# Patient Record
Sex: Female | Born: 1965
Health system: Southern US, Community
[De-identification: ages and names within clinical notes are randomized; demographics above are authoritative.]

## PROBLEM LIST (undated history)

## (undated) DIAGNOSIS — G43109 Migraine with aura, not intractable, without status migrainosus: Secondary | ICD-10-CM

## (undated) DIAGNOSIS — Z87440 Personal history of urinary (tract) infections: Secondary | ICD-10-CM

## (undated) DIAGNOSIS — E079 Disorder of thyroid, unspecified: Secondary | ICD-10-CM

## (undated) DIAGNOSIS — M5126 Other intervertebral disc displacement, lumbar region: Secondary | ICD-10-CM

## (undated) DIAGNOSIS — Z8742 Personal history of other diseases of the female genital tract: Secondary | ICD-10-CM

## (undated) DIAGNOSIS — R112 Nausea with vomiting, unspecified: Secondary | ICD-10-CM

## (undated) DIAGNOSIS — M792 Neuralgia and neuritis, unspecified: Secondary | ICD-10-CM

## (undated) DIAGNOSIS — M5430 Sciatica, unspecified side: Secondary | ICD-10-CM

## (undated) DIAGNOSIS — T4145XA Adverse effect of unspecified anesthetic, initial encounter: Secondary | ICD-10-CM

## (undated) DIAGNOSIS — Z872 Personal history of diseases of the skin and subcutaneous tissue: Secondary | ICD-10-CM

## (undated) DIAGNOSIS — Z9889 Other specified postprocedural states: Secondary | ICD-10-CM

## (undated) DIAGNOSIS — G8929 Other chronic pain: Secondary | ICD-10-CM

## (undated) DIAGNOSIS — I7300 Raynaud's syndrome without gangrene: Secondary | ICD-10-CM

## (undated) HISTORY — DX: Other chronic pain: G89.29

## (undated) HISTORY — DX: Other intervertebral disc displacement, lumbar region: M51.26

## (undated) HISTORY — DX: Neuralgia and neuritis, unspecified: M79.2

## (undated) HISTORY — DX: Personal history of other diseases of the female genital tract: Z87.42

## (undated) HISTORY — DX: Personal history of diseases of the skin and subcutaneous tissue: Z87.2

## (undated) HISTORY — PX: COLONOSCOPY: SHX174

## (undated) HISTORY — PX: CHOLECYSTECTOMY: SHX55

## (undated) HISTORY — DX: Personal history of urinary (tract) infections: Z87.440

## (undated) HISTORY — DX: Disorder of thyroid, unspecified: E07.9

## (undated) HISTORY — DX: Migraine with aura, not intractable, without status migrainosus: G43.109

## (undated) HISTORY — PX: TONSILLECTOMY: SUR1361

## (undated) HISTORY — DX: Sciatica, unspecified side: M54.30

## (undated) HISTORY — PX: KNEE SURGERY: SHX244

## (undated) HISTORY — DX: Raynaud's syndrome without gangrene: I73.00

---
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
## 1998-12-03 DIAGNOSIS — G43909 Migraine, unspecified, not intractable, without status migrainosus: Secondary | ICD-10-CM | POA: Insufficient documentation

## 2001-12-03 DIAGNOSIS — F419 Anxiety disorder, unspecified: Secondary | ICD-10-CM | POA: Insufficient documentation

## 2013-08-12 DIAGNOSIS — G43109 Migraine with aura, not intractable, without status migrainosus: Secondary | ICD-10-CM | POA: Insufficient documentation

## 2015-11-24 DIAGNOSIS — D649 Anemia, unspecified: Secondary | ICD-10-CM | POA: Insufficient documentation

## 2016-04-03 DIAGNOSIS — R590 Localized enlarged lymph nodes: Secondary | ICD-10-CM | POA: Insufficient documentation

## 2017-12-26 DIAGNOSIS — M9905 Segmental and somatic dysfunction of pelvic region: Secondary | ICD-10-CM | POA: Diagnosis not present

## 2017-12-26 DIAGNOSIS — M5417 Radiculopathy, lumbosacral region: Secondary | ICD-10-CM | POA: Diagnosis not present

## 2017-12-26 DIAGNOSIS — M9903 Segmental and somatic dysfunction of lumbar region: Secondary | ICD-10-CM | POA: Diagnosis not present

## 2017-12-27 DIAGNOSIS — M9905 Segmental and somatic dysfunction of pelvic region: Secondary | ICD-10-CM | POA: Diagnosis not present

## 2017-12-27 DIAGNOSIS — M5417 Radiculopathy, lumbosacral region: Secondary | ICD-10-CM | POA: Diagnosis not present

## 2017-12-27 DIAGNOSIS — M9903 Segmental and somatic dysfunction of lumbar region: Secondary | ICD-10-CM | POA: Diagnosis not present

## 2017-12-30 DIAGNOSIS — M9905 Segmental and somatic dysfunction of pelvic region: Secondary | ICD-10-CM | POA: Diagnosis not present

## 2017-12-30 DIAGNOSIS — M5417 Radiculopathy, lumbosacral region: Secondary | ICD-10-CM | POA: Diagnosis not present

## 2017-12-30 DIAGNOSIS — M9903 Segmental and somatic dysfunction of lumbar region: Secondary | ICD-10-CM | POA: Diagnosis not present

## 2018-01-01 DIAGNOSIS — M9905 Segmental and somatic dysfunction of pelvic region: Secondary | ICD-10-CM | POA: Diagnosis not present

## 2018-01-01 DIAGNOSIS — M5417 Radiculopathy, lumbosacral region: Secondary | ICD-10-CM | POA: Diagnosis not present

## 2018-01-01 DIAGNOSIS — M9903 Segmental and somatic dysfunction of lumbar region: Secondary | ICD-10-CM | POA: Diagnosis not present

## 2018-01-03 DIAGNOSIS — M9903 Segmental and somatic dysfunction of lumbar region: Secondary | ICD-10-CM | POA: Diagnosis not present

## 2018-01-03 DIAGNOSIS — M9905 Segmental and somatic dysfunction of pelvic region: Secondary | ICD-10-CM | POA: Diagnosis not present

## 2018-01-03 DIAGNOSIS — M5417 Radiculopathy, lumbosacral region: Secondary | ICD-10-CM | POA: Diagnosis not present

## 2018-01-06 DIAGNOSIS — M9903 Segmental and somatic dysfunction of lumbar region: Secondary | ICD-10-CM | POA: Diagnosis not present

## 2018-01-06 DIAGNOSIS — M9905 Segmental and somatic dysfunction of pelvic region: Secondary | ICD-10-CM | POA: Diagnosis not present

## 2018-01-06 DIAGNOSIS — M5417 Radiculopathy, lumbosacral region: Secondary | ICD-10-CM | POA: Diagnosis not present

## 2018-01-07 DIAGNOSIS — M9905 Segmental and somatic dysfunction of pelvic region: Secondary | ICD-10-CM | POA: Diagnosis not present

## 2018-01-07 DIAGNOSIS — M5417 Radiculopathy, lumbosacral region: Secondary | ICD-10-CM | POA: Diagnosis not present

## 2018-01-07 DIAGNOSIS — M9903 Segmental and somatic dysfunction of lumbar region: Secondary | ICD-10-CM | POA: Diagnosis not present

## 2018-01-20 DIAGNOSIS — M9905 Segmental and somatic dysfunction of pelvic region: Secondary | ICD-10-CM | POA: Diagnosis not present

## 2018-01-20 DIAGNOSIS — M5417 Radiculopathy, lumbosacral region: Secondary | ICD-10-CM | POA: Diagnosis not present

## 2018-01-20 DIAGNOSIS — M9903 Segmental and somatic dysfunction of lumbar region: Secondary | ICD-10-CM | POA: Diagnosis not present

## 2018-01-22 DIAGNOSIS — M9905 Segmental and somatic dysfunction of pelvic region: Secondary | ICD-10-CM | POA: Diagnosis not present

## 2018-01-22 DIAGNOSIS — M5417 Radiculopathy, lumbosacral region: Secondary | ICD-10-CM | POA: Diagnosis not present

## 2018-01-22 DIAGNOSIS — M9903 Segmental and somatic dysfunction of lumbar region: Secondary | ICD-10-CM | POA: Diagnosis not present

## 2018-01-24 DIAGNOSIS — M9903 Segmental and somatic dysfunction of lumbar region: Secondary | ICD-10-CM | POA: Diagnosis not present

## 2018-01-24 DIAGNOSIS — M9905 Segmental and somatic dysfunction of pelvic region: Secondary | ICD-10-CM | POA: Diagnosis not present

## 2018-01-24 DIAGNOSIS — M5417 Radiculopathy, lumbosacral region: Secondary | ICD-10-CM | POA: Diagnosis not present

## 2018-01-27 DIAGNOSIS — M5417 Radiculopathy, lumbosacral region: Secondary | ICD-10-CM | POA: Diagnosis not present

## 2018-01-27 DIAGNOSIS — M9903 Segmental and somatic dysfunction of lumbar region: Secondary | ICD-10-CM | POA: Diagnosis not present

## 2018-01-27 DIAGNOSIS — M9905 Segmental and somatic dysfunction of pelvic region: Secondary | ICD-10-CM | POA: Diagnosis not present

## 2018-01-31 DIAGNOSIS — M9903 Segmental and somatic dysfunction of lumbar region: Secondary | ICD-10-CM | POA: Diagnosis not present

## 2018-01-31 DIAGNOSIS — M9905 Segmental and somatic dysfunction of pelvic region: Secondary | ICD-10-CM | POA: Diagnosis not present

## 2018-01-31 DIAGNOSIS — M5417 Radiculopathy, lumbosacral region: Secondary | ICD-10-CM | POA: Diagnosis not present

## 2018-02-04 DIAGNOSIS — M5417 Radiculopathy, lumbosacral region: Secondary | ICD-10-CM | POA: Diagnosis not present

## 2018-02-04 DIAGNOSIS — M9905 Segmental and somatic dysfunction of pelvic region: Secondary | ICD-10-CM | POA: Diagnosis not present

## 2018-02-04 DIAGNOSIS — M9903 Segmental and somatic dysfunction of lumbar region: Secondary | ICD-10-CM | POA: Diagnosis not present

## 2018-02-06 DIAGNOSIS — M9905 Segmental and somatic dysfunction of pelvic region: Secondary | ICD-10-CM | POA: Diagnosis not present

## 2018-02-06 DIAGNOSIS — M9903 Segmental and somatic dysfunction of lumbar region: Secondary | ICD-10-CM | POA: Diagnosis not present

## 2018-02-06 DIAGNOSIS — M5417 Radiculopathy, lumbosacral region: Secondary | ICD-10-CM | POA: Diagnosis not present

## 2018-02-11 DIAGNOSIS — M9903 Segmental and somatic dysfunction of lumbar region: Secondary | ICD-10-CM | POA: Diagnosis not present

## 2018-02-11 DIAGNOSIS — M9905 Segmental and somatic dysfunction of pelvic region: Secondary | ICD-10-CM | POA: Diagnosis not present

## 2018-02-11 DIAGNOSIS — M5417 Radiculopathy, lumbosacral region: Secondary | ICD-10-CM | POA: Diagnosis not present

## 2018-02-13 DIAGNOSIS — M9905 Segmental and somatic dysfunction of pelvic region: Secondary | ICD-10-CM | POA: Diagnosis not present

## 2018-02-13 DIAGNOSIS — M5417 Radiculopathy, lumbosacral region: Secondary | ICD-10-CM | POA: Diagnosis not present

## 2018-02-13 DIAGNOSIS — M9903 Segmental and somatic dysfunction of lumbar region: Secondary | ICD-10-CM | POA: Diagnosis not present

## 2018-02-21 DIAGNOSIS — M9903 Segmental and somatic dysfunction of lumbar region: Secondary | ICD-10-CM | POA: Diagnosis not present

## 2018-02-21 DIAGNOSIS — M5417 Radiculopathy, lumbosacral region: Secondary | ICD-10-CM | POA: Diagnosis not present

## 2018-02-21 DIAGNOSIS — M9905 Segmental and somatic dysfunction of pelvic region: Secondary | ICD-10-CM | POA: Diagnosis not present

## 2018-02-26 DIAGNOSIS — M9905 Segmental and somatic dysfunction of pelvic region: Secondary | ICD-10-CM | POA: Diagnosis not present

## 2018-02-26 DIAGNOSIS — M5417 Radiculopathy, lumbosacral region: Secondary | ICD-10-CM | POA: Diagnosis not present

## 2018-02-26 DIAGNOSIS — M9903 Segmental and somatic dysfunction of lumbar region: Secondary | ICD-10-CM | POA: Diagnosis not present

## 2018-03-19 DIAGNOSIS — M25551 Pain in right hip: Secondary | ICD-10-CM | POA: Diagnosis not present

## 2018-03-19 DIAGNOSIS — M6281 Muscle weakness (generalized): Secondary | ICD-10-CM | POA: Diagnosis not present

## 2018-03-19 DIAGNOSIS — M25552 Pain in left hip: Secondary | ICD-10-CM | POA: Diagnosis not present

## 2018-03-24 DIAGNOSIS — M6281 Muscle weakness (generalized): Secondary | ICD-10-CM | POA: Diagnosis not present

## 2018-03-24 DIAGNOSIS — M25552 Pain in left hip: Secondary | ICD-10-CM | POA: Diagnosis not present

## 2018-03-24 DIAGNOSIS — M25551 Pain in right hip: Secondary | ICD-10-CM | POA: Diagnosis not present

## 2018-03-27 DIAGNOSIS — M25552 Pain in left hip: Secondary | ICD-10-CM | POA: Diagnosis not present

## 2018-03-27 DIAGNOSIS — M25551 Pain in right hip: Secondary | ICD-10-CM | POA: Diagnosis not present

## 2018-03-27 DIAGNOSIS — K140 Glossitis: Secondary | ICD-10-CM | POA: Diagnosis not present

## 2018-03-27 DIAGNOSIS — J309 Allergic rhinitis, unspecified: Secondary | ICD-10-CM | POA: Diagnosis not present

## 2018-03-27 DIAGNOSIS — M6281 Muscle weakness (generalized): Secondary | ICD-10-CM | POA: Diagnosis not present

## 2018-03-31 DIAGNOSIS — M25551 Pain in right hip: Secondary | ICD-10-CM | POA: Diagnosis not present

## 2018-03-31 DIAGNOSIS — M25552 Pain in left hip: Secondary | ICD-10-CM | POA: Diagnosis not present

## 2018-03-31 DIAGNOSIS — M6281 Muscle weakness (generalized): Secondary | ICD-10-CM | POA: Diagnosis not present

## 2018-04-07 DIAGNOSIS — M25551 Pain in right hip: Secondary | ICD-10-CM | POA: Diagnosis not present

## 2018-04-07 DIAGNOSIS — M25552 Pain in left hip: Secondary | ICD-10-CM | POA: Diagnosis not present

## 2018-04-07 DIAGNOSIS — M6281 Muscle weakness (generalized): Secondary | ICD-10-CM | POA: Diagnosis not present

## 2018-04-10 DIAGNOSIS — M7501 Adhesive capsulitis of right shoulder: Secondary | ICD-10-CM | POA: Diagnosis not present

## 2018-04-10 DIAGNOSIS — J309 Allergic rhinitis, unspecified: Secondary | ICD-10-CM | POA: Diagnosis not present

## 2018-04-10 DIAGNOSIS — R350 Frequency of micturition: Secondary | ICD-10-CM | POA: Diagnosis not present

## 2018-04-10 DIAGNOSIS — M169 Osteoarthritis of hip, unspecified: Secondary | ICD-10-CM | POA: Diagnosis not present

## 2018-04-14 DIAGNOSIS — M25552 Pain in left hip: Secondary | ICD-10-CM | POA: Diagnosis not present

## 2018-04-14 DIAGNOSIS — M25551 Pain in right hip: Secondary | ICD-10-CM | POA: Diagnosis not present

## 2018-04-14 DIAGNOSIS — M6281 Muscle weakness (generalized): Secondary | ICD-10-CM | POA: Diagnosis not present

## 2018-04-21 DIAGNOSIS — M25552 Pain in left hip: Secondary | ICD-10-CM | POA: Diagnosis not present

## 2018-04-21 DIAGNOSIS — M25551 Pain in right hip: Secondary | ICD-10-CM | POA: Diagnosis not present

## 2018-04-21 DIAGNOSIS — M6281 Muscle weakness (generalized): Secondary | ICD-10-CM | POA: Diagnosis not present

## 2018-04-30 DIAGNOSIS — M47816 Spondylosis without myelopathy or radiculopathy, lumbar region: Secondary | ICD-10-CM | POA: Diagnosis not present

## 2018-05-01 ENCOUNTER — Other Ambulatory Visit: Payer: Self-pay | Admitting: Internal Medicine

## 2018-05-01 DIAGNOSIS — Z1231 Encounter for screening mammogram for malignant neoplasm of breast: Secondary | ICD-10-CM | POA: Diagnosis not present

## 2018-05-01 DIAGNOSIS — N3000 Acute cystitis without hematuria: Secondary | ICD-10-CM | POA: Diagnosis not present

## 2018-05-05 DIAGNOSIS — M545 Low back pain: Secondary | ICD-10-CM | POA: Diagnosis not present

## 2018-06-06 ENCOUNTER — Ambulatory Visit
Admission: RE | Admit: 2018-06-06 | Discharge: 2018-06-06 | Disposition: A | Discharge status: Home / Self Care | Payer: 59 | Source: Ambulatory Visit | Attending: Internal Medicine | Admitting: Internal Medicine

## 2018-06-06 ENCOUNTER — Ambulatory Visit: Payer: Self-pay

## 2018-06-06 DIAGNOSIS — Z1231 Encounter for screening mammogram for malignant neoplasm of breast: Secondary | ICD-10-CM | POA: Diagnosis not present

## 2018-09-10 DIAGNOSIS — D225 Melanocytic nevi of trunk: Secondary | ICD-10-CM | POA: Diagnosis not present

## 2018-09-10 DIAGNOSIS — L821 Other seborrheic keratosis: Secondary | ICD-10-CM | POA: Diagnosis not present

## 2018-09-10 DIAGNOSIS — D485 Neoplasm of uncertain behavior of skin: Secondary | ICD-10-CM | POA: Diagnosis not present

## 2018-09-10 DIAGNOSIS — D2262 Melanocytic nevi of left upper limb, including shoulder: Secondary | ICD-10-CM | POA: Diagnosis not present

## 2018-09-10 DIAGNOSIS — D2362 Other benign neoplasm of skin of left upper limb, including shoulder: Secondary | ICD-10-CM | POA: Diagnosis not present

## 2018-09-12 DIAGNOSIS — M5416 Radiculopathy, lumbar region: Secondary | ICD-10-CM | POA: Insufficient documentation

## 2018-09-12 DIAGNOSIS — M5116 Intervertebral disc disorders with radiculopathy, lumbar region: Secondary | ICD-10-CM | POA: Diagnosis not present

## 2018-09-12 DIAGNOSIS — G8929 Other chronic pain: Secondary | ICD-10-CM | POA: Diagnosis not present

## 2018-09-30 DIAGNOSIS — M48062 Spinal stenosis, lumbar region with neurogenic claudication: Secondary | ICD-10-CM | POA: Diagnosis not present

## 2018-09-30 DIAGNOSIS — M25551 Pain in right hip: Secondary | ICD-10-CM | POA: Diagnosis not present

## 2018-09-30 DIAGNOSIS — M545 Low back pain: Secondary | ICD-10-CM | POA: Diagnosis not present

## 2018-10-01 DIAGNOSIS — Z23 Encounter for immunization: Secondary | ICD-10-CM | POA: Diagnosis not present

## 2018-10-01 DIAGNOSIS — R300 Dysuria: Secondary | ICD-10-CM | POA: Diagnosis not present

## 2018-10-01 DIAGNOSIS — R358 Other polyuria: Secondary | ICD-10-CM | POA: Diagnosis not present

## 2018-10-06 DIAGNOSIS — M5432 Sciatica, left side: Secondary | ICD-10-CM | POA: Diagnosis not present

## 2018-10-06 DIAGNOSIS — M5431 Sciatica, right side: Secondary | ICD-10-CM | POA: Diagnosis not present

## 2018-10-06 DIAGNOSIS — M545 Low back pain: Secondary | ICD-10-CM | POA: Diagnosis not present

## 2018-10-08 DIAGNOSIS — M5431 Sciatica, right side: Secondary | ICD-10-CM | POA: Diagnosis not present

## 2018-10-08 DIAGNOSIS — M5432 Sciatica, left side: Secondary | ICD-10-CM | POA: Diagnosis not present

## 2018-10-08 DIAGNOSIS — M545 Low back pain: Secondary | ICD-10-CM | POA: Diagnosis not present

## 2018-10-13 DIAGNOSIS — M545 Low back pain: Secondary | ICD-10-CM | POA: Diagnosis not present

## 2018-10-13 DIAGNOSIS — M5432 Sciatica, left side: Secondary | ICD-10-CM | POA: Diagnosis not present

## 2018-10-13 DIAGNOSIS — M5431 Sciatica, right side: Secondary | ICD-10-CM | POA: Diagnosis not present

## 2018-10-15 DIAGNOSIS — M5431 Sciatica, right side: Secondary | ICD-10-CM | POA: Diagnosis not present

## 2018-10-15 DIAGNOSIS — M545 Low back pain: Secondary | ICD-10-CM | POA: Diagnosis not present

## 2018-10-15 DIAGNOSIS — M5432 Sciatica, left side: Secondary | ICD-10-CM | POA: Diagnosis not present

## 2018-10-20 DIAGNOSIS — M5431 Sciatica, right side: Secondary | ICD-10-CM | POA: Diagnosis not present

## 2018-10-20 DIAGNOSIS — M5432 Sciatica, left side: Secondary | ICD-10-CM | POA: Diagnosis not present

## 2018-10-20 DIAGNOSIS — M545 Low back pain: Secondary | ICD-10-CM | POA: Diagnosis not present

## 2018-10-23 DIAGNOSIS — M5432 Sciatica, left side: Secondary | ICD-10-CM | POA: Diagnosis not present

## 2018-10-23 DIAGNOSIS — M545 Low back pain: Secondary | ICD-10-CM | POA: Diagnosis not present

## 2018-10-23 DIAGNOSIS — M5431 Sciatica, right side: Secondary | ICD-10-CM | POA: Diagnosis not present

## 2018-10-28 DIAGNOSIS — M545 Low back pain: Secondary | ICD-10-CM | POA: Diagnosis not present

## 2018-10-28 DIAGNOSIS — M5431 Sciatica, right side: Secondary | ICD-10-CM | POA: Diagnosis not present

## 2018-10-28 DIAGNOSIS — M5432 Sciatica, left side: Secondary | ICD-10-CM | POA: Diagnosis not present

## 2018-11-03 DIAGNOSIS — M545 Low back pain: Secondary | ICD-10-CM | POA: Diagnosis not present

## 2018-11-03 DIAGNOSIS — M5432 Sciatica, left side: Secondary | ICD-10-CM | POA: Diagnosis not present

## 2018-11-03 DIAGNOSIS — M5431 Sciatica, right side: Secondary | ICD-10-CM | POA: Diagnosis not present

## 2018-11-06 DIAGNOSIS — M5432 Sciatica, left side: Secondary | ICD-10-CM | POA: Diagnosis not present

## 2018-11-06 DIAGNOSIS — M545 Low back pain: Secondary | ICD-10-CM | POA: Diagnosis not present

## 2018-11-06 DIAGNOSIS — M5431 Sciatica, right side: Secondary | ICD-10-CM | POA: Diagnosis not present

## 2018-11-11 DIAGNOSIS — M5431 Sciatica, right side: Secondary | ICD-10-CM | POA: Diagnosis not present

## 2018-11-11 DIAGNOSIS — M545 Low back pain: Secondary | ICD-10-CM | POA: Diagnosis not present

## 2018-11-11 DIAGNOSIS — M5432 Sciatica, left side: Secondary | ICD-10-CM | POA: Diagnosis not present

## 2018-11-14 DIAGNOSIS — M5432 Sciatica, left side: Secondary | ICD-10-CM | POA: Diagnosis not present

## 2018-11-14 DIAGNOSIS — M545 Low back pain: Secondary | ICD-10-CM | POA: Diagnosis not present

## 2018-11-14 DIAGNOSIS — M5431 Sciatica, right side: Secondary | ICD-10-CM | POA: Diagnosis not present

## 2018-11-18 DIAGNOSIS — M5431 Sciatica, right side: Secondary | ICD-10-CM | POA: Diagnosis not present

## 2018-11-18 DIAGNOSIS — M5432 Sciatica, left side: Secondary | ICD-10-CM | POA: Diagnosis not present

## 2018-11-18 DIAGNOSIS — M545 Low back pain: Secondary | ICD-10-CM | POA: Diagnosis not present

## 2018-11-20 DIAGNOSIS — M545 Low back pain: Secondary | ICD-10-CM | POA: Diagnosis not present

## 2018-11-20 DIAGNOSIS — M5432 Sciatica, left side: Secondary | ICD-10-CM | POA: Diagnosis not present

## 2018-11-20 DIAGNOSIS — M5431 Sciatica, right side: Secondary | ICD-10-CM | POA: Diagnosis not present

## 2018-11-24 DIAGNOSIS — M5431 Sciatica, right side: Secondary | ICD-10-CM | POA: Diagnosis not present

## 2018-11-24 DIAGNOSIS — M5432 Sciatica, left side: Secondary | ICD-10-CM | POA: Diagnosis not present

## 2018-11-24 DIAGNOSIS — M545 Low back pain: Secondary | ICD-10-CM | POA: Diagnosis not present

## 2018-12-02 DIAGNOSIS — M5432 Sciatica, left side: Secondary | ICD-10-CM | POA: Diagnosis not present

## 2018-12-02 DIAGNOSIS — M5431 Sciatica, right side: Secondary | ICD-10-CM | POA: Diagnosis not present

## 2018-12-02 DIAGNOSIS — M545 Low back pain: Secondary | ICD-10-CM | POA: Diagnosis not present

## 2018-12-08 DIAGNOSIS — M545 Low back pain: Secondary | ICD-10-CM | POA: Diagnosis not present

## 2018-12-08 DIAGNOSIS — M5431 Sciatica, right side: Secondary | ICD-10-CM | POA: Diagnosis not present

## 2018-12-08 DIAGNOSIS — M5432 Sciatica, left side: Secondary | ICD-10-CM | POA: Diagnosis not present

## 2018-12-11 DIAGNOSIS — M545 Low back pain: Secondary | ICD-10-CM | POA: Diagnosis not present

## 2018-12-11 DIAGNOSIS — M5432 Sciatica, left side: Secondary | ICD-10-CM | POA: Diagnosis not present

## 2018-12-11 DIAGNOSIS — M5431 Sciatica, right side: Secondary | ICD-10-CM | POA: Diagnosis not present

## 2018-12-15 DIAGNOSIS — M5431 Sciatica, right side: Secondary | ICD-10-CM | POA: Diagnosis not present

## 2018-12-15 DIAGNOSIS — M5432 Sciatica, left side: Secondary | ICD-10-CM | POA: Diagnosis not present

## 2018-12-15 DIAGNOSIS — M545 Low back pain: Secondary | ICD-10-CM | POA: Diagnosis not present

## 2018-12-17 DIAGNOSIS — M545 Low back pain: Secondary | ICD-10-CM | POA: Diagnosis not present

## 2018-12-17 DIAGNOSIS — M5431 Sciatica, right side: Secondary | ICD-10-CM | POA: Diagnosis not present

## 2018-12-17 DIAGNOSIS — M5432 Sciatica, left side: Secondary | ICD-10-CM | POA: Diagnosis not present

## 2018-12-22 DIAGNOSIS — M545 Low back pain: Secondary | ICD-10-CM | POA: Diagnosis not present

## 2018-12-22 DIAGNOSIS — M5432 Sciatica, left side: Secondary | ICD-10-CM | POA: Diagnosis not present

## 2018-12-22 DIAGNOSIS — M5431 Sciatica, right side: Secondary | ICD-10-CM | POA: Diagnosis not present

## 2018-12-26 DIAGNOSIS — M5431 Sciatica, right side: Secondary | ICD-10-CM | POA: Diagnosis not present

## 2018-12-26 DIAGNOSIS — M545 Low back pain: Secondary | ICD-10-CM | POA: Diagnosis not present

## 2018-12-26 DIAGNOSIS — M5432 Sciatica, left side: Secondary | ICD-10-CM | POA: Diagnosis not present

## 2018-12-29 DIAGNOSIS — M5431 Sciatica, right side: Secondary | ICD-10-CM | POA: Diagnosis not present

## 2018-12-29 DIAGNOSIS — M545 Low back pain: Secondary | ICD-10-CM | POA: Diagnosis not present

## 2018-12-29 DIAGNOSIS — M5432 Sciatica, left side: Secondary | ICD-10-CM | POA: Diagnosis not present

## 2019-01-02 DIAGNOSIS — M545 Low back pain: Secondary | ICD-10-CM | POA: Diagnosis not present

## 2019-01-02 DIAGNOSIS — M5431 Sciatica, right side: Secondary | ICD-10-CM | POA: Diagnosis not present

## 2019-01-02 DIAGNOSIS — M5432 Sciatica, left side: Secondary | ICD-10-CM | POA: Diagnosis not present

## 2019-01-05 DIAGNOSIS — M545 Low back pain: Secondary | ICD-10-CM | POA: Diagnosis not present

## 2019-01-05 DIAGNOSIS — M5432 Sciatica, left side: Secondary | ICD-10-CM | POA: Diagnosis not present

## 2019-01-05 DIAGNOSIS — M5431 Sciatica, right side: Secondary | ICD-10-CM | POA: Diagnosis not present

## 2019-01-07 DIAGNOSIS — M545 Low back pain: Secondary | ICD-10-CM | POA: Diagnosis not present

## 2019-01-07 DIAGNOSIS — M5432 Sciatica, left side: Secondary | ICD-10-CM | POA: Diagnosis not present

## 2019-01-07 DIAGNOSIS — M5431 Sciatica, right side: Secondary | ICD-10-CM | POA: Diagnosis not present

## 2019-01-12 DIAGNOSIS — M545 Low back pain: Secondary | ICD-10-CM | POA: Diagnosis not present

## 2019-01-12 DIAGNOSIS — M5432 Sciatica, left side: Secondary | ICD-10-CM | POA: Diagnosis not present

## 2019-01-12 DIAGNOSIS — M5431 Sciatica, right side: Secondary | ICD-10-CM | POA: Diagnosis not present

## 2019-01-14 DIAGNOSIS — M5431 Sciatica, right side: Secondary | ICD-10-CM | POA: Diagnosis not present

## 2019-01-14 DIAGNOSIS — M545 Low back pain: Secondary | ICD-10-CM | POA: Diagnosis not present

## 2019-01-14 DIAGNOSIS — M5432 Sciatica, left side: Secondary | ICD-10-CM | POA: Diagnosis not present

## 2019-01-26 DIAGNOSIS — M5431 Sciatica, right side: Secondary | ICD-10-CM | POA: Diagnosis not present

## 2019-01-26 DIAGNOSIS — M545 Low back pain: Secondary | ICD-10-CM | POA: Diagnosis not present

## 2019-01-26 DIAGNOSIS — M5432 Sciatica, left side: Secondary | ICD-10-CM | POA: Diagnosis not present

## 2019-01-28 DIAGNOSIS — M5432 Sciatica, left side: Secondary | ICD-10-CM | POA: Diagnosis not present

## 2019-01-28 DIAGNOSIS — M5431 Sciatica, right side: Secondary | ICD-10-CM | POA: Diagnosis not present

## 2019-01-28 DIAGNOSIS — M545 Low back pain: Secondary | ICD-10-CM | POA: Diagnosis not present

## 2019-01-30 DIAGNOSIS — M25551 Pain in right hip: Secondary | ICD-10-CM | POA: Diagnosis not present

## 2019-01-30 DIAGNOSIS — R030 Elevated blood-pressure reading, without diagnosis of hypertension: Secondary | ICD-10-CM | POA: Diagnosis not present

## 2019-02-10 DIAGNOSIS — M5432 Sciatica, left side: Secondary | ICD-10-CM | POA: Diagnosis not present

## 2019-02-10 DIAGNOSIS — M5431 Sciatica, right side: Secondary | ICD-10-CM | POA: Diagnosis not present

## 2019-02-10 DIAGNOSIS — M545 Low back pain: Secondary | ICD-10-CM | POA: Diagnosis not present

## 2019-02-15 DIAGNOSIS — J069 Acute upper respiratory infection, unspecified: Secondary | ICD-10-CM | POA: Diagnosis not present

## 2019-05-04 ENCOUNTER — Other Ambulatory Visit: Payer: Self-pay | Admitting: Internal Medicine

## 2019-05-04 DIAGNOSIS — Z1231 Encounter for screening mammogram for malignant neoplasm of breast: Secondary | ICD-10-CM

## 2019-05-04 DIAGNOSIS — M810 Age-related osteoporosis without current pathological fracture: Secondary | ICD-10-CM

## 2019-07-23 ENCOUNTER — Ambulatory Visit
Admission: RE | Admit: 2019-07-23 | Discharge: 2019-07-23 | Disposition: A | Discharge status: Home / Self Care | Payer: 59 | Source: Ambulatory Visit | Attending: Internal Medicine | Admitting: Internal Medicine

## 2019-07-23 ENCOUNTER — Other Ambulatory Visit: Payer: Self-pay

## 2019-07-23 DIAGNOSIS — Z1231 Encounter for screening mammogram for malignant neoplasm of breast: Secondary | ICD-10-CM

## 2019-07-23 DIAGNOSIS — M810 Age-related osteoporosis without current pathological fracture: Secondary | ICD-10-CM

## 2019-07-27 ENCOUNTER — Other Ambulatory Visit: Payer: Self-pay | Admitting: Internal Medicine

## 2019-07-27 DIAGNOSIS — R928 Other abnormal and inconclusive findings on diagnostic imaging of breast: Secondary | ICD-10-CM

## 2019-07-29 ENCOUNTER — Other Ambulatory Visit: Payer: Self-pay

## 2019-07-29 ENCOUNTER — Ambulatory Visit
Admission: RE | Admit: 2019-07-29 | Discharge: 2019-07-29 | Disposition: A | Discharge status: Home / Self Care | Payer: 59 | Source: Ambulatory Visit | Attending: Internal Medicine | Admitting: Internal Medicine

## 2019-07-29 DIAGNOSIS — R928 Other abnormal and inconclusive findings on diagnostic imaging of breast: Secondary | ICD-10-CM

## 2019-08-12 ENCOUNTER — Other Ambulatory Visit (HOSPITAL_COMMUNITY)
Admission: RE | Admit: 2019-08-12 | Discharge: 2019-08-12 | Disposition: A | Discharge status: Home / Self Care | Payer: 59 | Source: Ambulatory Visit | Attending: Internal Medicine | Admitting: Internal Medicine

## 2019-08-12 DIAGNOSIS — Z124 Encounter for screening for malignant neoplasm of cervix: Secondary | ICD-10-CM | POA: Insufficient documentation

## 2019-08-13 ENCOUNTER — Other Ambulatory Visit: Payer: Self-pay | Admitting: Internal Medicine

## 2019-08-15 LAB — CYTOLOGY - PAP
Adequacy: ABSENT
Diagnosis: NEGATIVE
HPV: NOT DETECTED

## 2019-09-17 ENCOUNTER — Other Ambulatory Visit: Payer: Self-pay

## 2019-09-21 ENCOUNTER — Other Ambulatory Visit: Payer: Self-pay

## 2019-09-21 ENCOUNTER — Encounter: Payer: Self-pay | Admitting: Obstetrics and Gynecology

## 2019-09-21 ENCOUNTER — Ambulatory Visit: Payer: 59 | Admitting: Obstetrics and Gynecology

## 2019-09-21 VITALS — BP 122/82 | HR 72 | Temp 97.1°F | Wt 135.4 lb

## 2019-09-21 DIAGNOSIS — M549 Dorsalgia, unspecified: Secondary | ICD-10-CM | POA: Insufficient documentation

## 2019-09-21 DIAGNOSIS — N951 Menopausal and female climacteric states: Secondary | ICD-10-CM

## 2019-09-21 DIAGNOSIS — G8929 Other chronic pain: Secondary | ICD-10-CM | POA: Insufficient documentation

## 2019-09-21 DIAGNOSIS — N390 Urinary tract infection, site not specified: Secondary | ICD-10-CM

## 2019-09-21 DIAGNOSIS — R102 Pelvic and perineal pain: Secondary | ICD-10-CM

## 2019-09-21 DIAGNOSIS — R109 Unspecified abdominal pain: Secondary | ICD-10-CM | POA: Diagnosis not present

## 2019-09-21 MED ORDER — MEDROXYPROGESTERONE ACETATE 5 MG PO TABS: ORAL_TABLET | ORAL | 1 refills | Status: DC

## 2019-09-21 MED ORDER — NITROFURANTOIN MACROCRYSTAL 50 MG PO CAPS: ORAL_CAPSULE | ORAL | 2 refills | Status: DC

## 2019-09-21 NOTE — Patient Instructions (Signed)

## 2019-09-21 NOTE — Progress Notes (Signed)
53 y.o. [id number] Married White or Caucasian Not Hispanic or Latino female here for pelvic pain that is intermittent and sharp.  She has a 6-8 week h/o intermittent, stabbing pain in the right lower quadrant/pelvis. The pain only lasts for a few seconds, getting less intense with time. Initially the pain was daily and severe. Now it is moderate in intensity and only occurring a few x a week.   She was seen at Urgent care for a UTI a few weeks ago, the provider suggested she have her ovaries evaluated.  BM every day, diarrhea with milk products. No bladder symptoms currently.   She has been getting 2-3 UTI's a year for the last several years, always related to intercourse.   She was having clockwork 28 days cycles until the last year. In May she then went 110 days between her cycles. Since August her cycles have been every 21-24 days. She had a couple of close cycles 15-18 days last spring.  She had hot flashes last winter, they went away. Slight vaginal dryness, no pain with intercourse.   Period Cycle (Days): 21 Period Duration (Days): 4- 5 days Period Pattern: (!) Irregular Menstrual Flow: Moderate Menstrual Control: Panty liner, Tampon, Thin pad Menstrual Control Change Freq (Hours): changes pad/tampon every 6-8 hours Dysmenorrhea: None  Patient's last menstrual period was 09/10/2019 (exact date).          Sexually active: Yes.    The current method of family planning is condoms most of the time.    Exercising: Yes.    walking, gym, golf Smoker:  no  Health Maintenance: Pap:  08/13/2019 WNL NEG HPV History of abnormal Pap:  no MMG:  07/29/2019 Birads 2 benign BMD:   07/23/2019 WNL Colonoscopy: 2014 WNL done in [place], told f/u in 10 years. TDaP:  UTD Gardasil: N/A   reports that she has never smoked. She has never used smokeless tobacco. She reports current alcohol use of about 2.0 standard drinks of alcohol per week. She reports that she does not use drugs. Moved here from  [place] 2 years ago for husbands jobs. Daughters are 18 and 17, both at [place], both in the [employer]. She works as a Writer.   Past Medical History:  Diagnosis Date  . Chronic back pain   . History of cyst of breast   . History of ovarian cyst   . History of recurrent UTIs   . Migraine with aura   . Raynaud disease   . Ruptured lumbar intervertebral disc    3   Torn labrum in both hips.   Past Surgical History:  Procedure Laterality Date  . CHOLECYSTECTOMY     2009  . KNEE SURGERY     right knee, 2011    Current Outpatient Medications  Medication Sig Dispense Refill  . ciclopirox (PENLAC) 8 % solution Apply 1 application topically daily.     No current facility-administered medications for this visit.     Family History  Problem Relation Age of Onset  . Osteoporosis Mother   . Stroke Mother   . Migraines Mother   . Myelodysplastic syndrome Father   . Heart disease Father   . Bladder Cancer Paternal Uncle   . Stroke Maternal Grandmother   . Stroke Paternal Grandmother   . Lung cancer Maternal Grandfather     Review of Systems  Constitutional: Negative.   HENT: Negative.   Eyes: Negative.   Respiratory: Negative.   Cardiovascular: Negative.   Gastrointestinal: Negative.  Endocrine: Negative.   Genitourinary:       Pelvic pain  Musculoskeletal: Negative.   Skin: Negative.   Allergic/Immunologic: Negative.   Neurological: Negative.   Hematological: Negative.   Psychiatric/Behavioral: Negative.   No dyspareunia.   Exam:   BP 122/82 (BP Location: Right Arm, Patient Position: Sitting, Cuff Size: Normal)   Pulse 72   Temp (!) 97.1 F (36.2 C) (Skin)   Wt 135 lb 6.4 oz (61.4 kg)   LMP 09/10/2019 (Exact Date)   Weight change: @WEIGHTCHANGE @ Height:      Ht Readings from Last 3 Encounters:  No data found for Ht    General appearance: alert, cooperative and appears stated age Head: Normocephalic, without obvious abnormality, atraumatic Neck:  no adenopathy, supple, symmetrical, trachea midline and thyroid normal to inspection and palpation Lungs: clear to auscultation bilaterally Cardiovascular: regular rate and rhythm Abdomen: soft, point tenderness laterally in the RLQ, no rebound, no guarding, non distended,  no masses,  no organomegaly Extremities: extremities normal, atraumatic, no cyanosis or edema Skin: Skin color, texture, turgor normal. No rashes or lesions Lymph nodes: Cervical, supraclavicular, and axillary nodes normal. No abnormal inguinal nodes palpated Neurologic: Grossly normal   Pelvic: External genitalia:  no lesions              Urethra:  normal appearing urethra with no masses, tenderness or lesions              Bartholins and Skenes: normal                 Vagina: normal appearing vagina with normal color and discharge, no lesions              Cervix: no cervical motion tenderness and no lesions               Bimanual Exam:  Uterus:  uterus slightly irregular contour, top normal sized, mobile, not tender.               Adnexa: no mass, fullness, tenderness               Rectovaginal: Confirms               Anus:  normal sphincter tone, no lesions  Bladder: not tender  Pelvic floor: not tender  Chaperone was present for exam.  A:  Abdominal pelvic pain, etiology not clear  Perimenopausal  Irregular cycles  Recurrent UTI's with intercourse  P:   Return for gyn ultrasound  Cyclic provera if no spontaneous cycle  Macrodantin for UTI prophylaxis with intercourse.

## 2019-09-28 ENCOUNTER — Other Ambulatory Visit: Payer: Self-pay

## 2019-09-28 NOTE — Progress Notes (Signed)
GYNECOLOGY  VISIT   HPI: 53 y.o.   Married White or Caucasian Not Hispanic or Latino  female   [phone] with Patient's last menstrual period was 09/10/2019 (exact date).   here for evaluation of abdominal/pelvic pain. The patient has an ~2 month h/o intermittent, stabbing pain in the RLQ/pelvis.  GYNECOLOGIC HISTORY: Patient's last menstrual period was 09/10/2019 (exact date). Contraception: condoms Menopausal hormone therapy: just prescribed cyclic provera.         OB History    Gravida  2   Para  2   Term  2   Preterm      AB      Living  2     SAB      TAB      Ectopic      Multiple      Live Births  2              Patient Active Problem List   Diagnosis Date Noted  . Chronic back pain   . Lumbar radiculopathy 09/12/2018    Past Medical History:  Diagnosis Date  . Chronic back pain   . History of cyst of breast   . History of ovarian cyst   . History of recurrent UTIs   . Migraine with aura   . Raynaud disease   . Ruptured lumbar intervertebral disc    3     Past Surgical History:  Procedure Laterality Date  . CHOLECYSTECTOMY     2009  . KNEE SURGERY     right knee, 2011    Current Outpatient Medications  Medication Sig Dispense Refill  . ciclopirox (PENLAC) 8 % solution Apply 1 application topically daily.    . medroxyPROGESTERone (PROVERA) 5 MG tablet 1 tablet po q day x 5 days every other month if no spontaneous menses 15 tablet 1  . nitrofurantoin (MACRODANTIN) 50 MG capsule 1 tablet po prn intercourse 30 capsule 2   No current facility-administered medications for this visit.      ALLERGIES: Ciprofloxacin, Milk-related compounds, and Prednisone  Family History  Problem Relation Age of Onset  . Osteoporosis Mother   . Stroke Mother   . Migraines Mother   . Myelodysplastic syndrome Father   . Heart disease Father   . Bladder Cancer Paternal Uncle   . Stroke Maternal Grandmother   . Stroke Paternal Grandmother   . Lung cancer  Maternal Grandfather     Social History   Socioeconomic History  . Marital status: Married    Spouse name: Not on file  . Number of children: Not on file  . Years of education: Not on file  . Highest education level: Not on file  Occupational History  . Not on file  Social Needs  . Financial resource strain: Not on file  . Food insecurity    Worry: Not on file    Inability: Not on file  . Transportation needs    Medical: Not on file    Non-medical: Not on file  Tobacco Use  . Smoking status: Never Smoker  . Smokeless tobacco: Never Used  Substance and Sexual Activity  . Alcohol use: Yes    Alcohol/week: 2.0 standard drinks    Types: 2 Glasses of wine per week  . Drug use: Never  . Sexual activity: Yes    Birth control/protection: Condom  Lifestyle  . Physical activity    Days per week: Not on file    Minutes per session: Not on file  .  Stress: Not on file  Relationships  . Social Herbalist on phone: Not on file    Gets together: Not on file    Attends religious service: Not on file    Active member of club or organization: Not on file    Attends meetings of clubs or organizations: Not on file    Relationship status: Not on file  . Intimate partner violence    Fear of current or ex partner: Not on file    Emotionally abused: Not on file    Physically abused: Not on file    Forced sexual activity: Not on file  Other Topics Concern  . Not on file  Social History Narrative  . Not on file    Review of Systems  Constitutional: Negative.   HENT: Negative.   Eyes: Negative.   Respiratory: Negative.   Cardiovascular: Negative.   Gastrointestinal: Negative.   Genitourinary: Negative.   Musculoskeletal: Negative.   Skin: Negative.   Neurological: Negative.   Endo/Heme/Allergies: Negative.   Psychiatric/Behavioral: Negative.     PHYSICAL EXAMINATION:    LMP 09/10/2019 (Exact Date)     General appearance: alert, cooperative and appears stated  age  Ultrasound images reviewed with the patient, complex right adnexal cyst, nodularity with flow noted. Multiple small myomas noted.   ASSESSMENT Abdominal/pelvic pain Complex right adnexal cyst, nodularity with flow Fibroid uterus    PLAN CA 125 Further plan of MRI vs referral and surgery depending on CA 125 results.   An After Visit Summary was printed and given to the patient.

## 2019-09-29 ENCOUNTER — Ambulatory Visit (INDEPENDENT_AMBULATORY_CARE_PROVIDER_SITE_OTHER): Payer: 59

## 2019-09-29 ENCOUNTER — Ambulatory Visit: Payer: 59 | Admitting: Obstetrics and Gynecology

## 2019-09-29 ENCOUNTER — Encounter: Payer: Self-pay | Admitting: Obstetrics and Gynecology

## 2019-09-29 VITALS — BP 120/82 | HR 72 | Temp 97.2°F | Wt 134.4 lb

## 2019-09-29 DIAGNOSIS — D259 Leiomyoma of uterus, unspecified: Secondary | ICD-10-CM | POA: Diagnosis not present

## 2019-09-29 DIAGNOSIS — N83201 Unspecified ovarian cyst, right side: Secondary | ICD-10-CM | POA: Diagnosis not present

## 2019-09-29 DIAGNOSIS — R109 Unspecified abdominal pain: Secondary | ICD-10-CM

## 2019-09-29 DIAGNOSIS — R102 Pelvic and perineal pain: Secondary | ICD-10-CM | POA: Diagnosis not present

## 2019-09-29 NOTE — Patient Instructions (Signed)
Uterine Fibroids  Uterine fibroids (leiomyomas) are noncancerous (benign) tumors that can develop in the uterus. Fibroids may also develop in the fallopian tubes, cervix, or tissues (ligaments) near the uterus. You may have one or many fibroids. Fibroids vary in size, weight, and where they grow in the uterus. Some can become quite large. Most fibroids do not require medical treatment. What are the causes? The cause of this condition is not known. What increases the risk? You are more likely to develop this condition if you:  Are in your 30s or 40s and have not gone through menopause.  Have a family history of this condition.  Are of African-American descent.  Had your first period at an early age (early menarche).  Have not had any children (nulliparity).  Are overweight or obese. What are the signs or symptoms? Many women do not have any symptoms. Symptoms of this condition may include:  Heavy menstrual bleeding.  Bleeding or spotting between periods.  Pain and pressure in the pelvic area, between the hips.  Bladder problems, such as needing to urinate urgently or more often than usual.  Inability to have children (infertility).  Failure to carry pregnancy to term (miscarriage). How is this diagnosed? This condition may be diagnosed based on:  Your symptoms and medical history.  A physical exam.  A pelvic exam that includes feeling for any tumors.  Imaging tests, such as ultrasound or MRI. How is this treated? Treatment for this condition may include:  Seeing your health care provider for follow-up visits to monitor your fibroids for any changes.  Taking NSAIDs such as ibuprofen, naproxen, or aspirin to reduce pain.  Hormone medicines. These may be taken as a pill, given in an injection, or delivered by a T-shaped device that is inserted into the uterus (intrauterine device, IUD).  Surgery to remove one of the following: ? The fibroids (myomectomy). Your health  care provider may recommend this if fibroids affect your fertility and you want to become pregnant. ? The uterus (hysterectomy). ? Blood supply to the fibroids (uterine artery embolization). Follow these instructions at home:  Take over-the-counter and prescription medicines only as told by your health care provider.  Ask your health care provider if you should take iron pills or eat more iron-rich foods, such as dark green, leafy vegetables. Heavy menstrual bleeding can cause low iron levels.  If directed, apply heat to your back or abdomen to reduce pain. Use the heat source that your health care provider recommends, such as a moist heat pack or a heating pad. ? Place a towel between your skin and the heat source. ? Leave the heat on for 20-30 minutes. ? Remove the heat if your skin turns bright red. This is especially important if you are unable to feel pain, heat, or cold. You may have a greater risk of getting burned.  Pay close attention to your menstrual cycle. Tell your health care provider about any changes, such as: ? Increased blood flow that requires you to use more pads or tampons than usual. ? A change in the number of days that your period lasts. ? A change in symptoms that are associated with your period, such as back pain or cramps in your abdomen.  Keep all follow-up visits as told by your health care provider. This is important, especially if your fibroids need to be monitored for any changes. Contact a health care provider if you:  Have pelvic pain, back pain, or cramps in your abdomen that   do not get better with medicine or heat.  Develop new bleeding between periods.  Have increased bleeding during or between periods.  Feel unusually tired or weak.  Feel light-headed. Get help right away if you:  Faint.  Have pelvic pain that suddenly gets worse.  Have severe vaginal bleeding that soaks a tampon or pad in 30 minutes or less. Summary  Uterine fibroids are  noncancerous (benign) tumors that can develop in the uterus.  The exact cause of this condition is not known.  Most fibroids do not require medical treatment unless they affect your ability to have children (fertility).  Contact a health care provider if you have pelvic pain, back pain, or cramps in your abdomen that do not get better with medicines.  Make sure you know what symptoms should cause you to get help right away. This information is not intended to replace advice given to you by your health care provider. Make sure you discuss any questions you have with your health care provider. Document Released: 11/16/2000 Document Revised: 11/01/2017 Document Reviewed: 10/15/2017 Elsevier Patient Education  2020 [employer]. Ovarian Cyst     An ovarian cyst is a fluid-filled sac that forms on an ovary. The ovaries are small organs that produce eggs in women. Various types of cysts can form on the ovaries. Some may cause symptoms and require treatment. Most ovarian cysts go away on their own, are not cancerous (are benign), and do not cause problems. Common types of ovarian cysts include:  Functional (follicle) cysts. ? Occur during the menstrual cycle, and usually go away with the next menstrual cycle if you do not get pregnant. ? Usually cause no symptoms.  Endometriomas. ? Are cysts that form from the tissue that lines the uterus (endometrium). ? Are sometimes called "chocolate cysts" because they become filled with blood that turns brown. ? Can cause pain in the lower abdomen during intercourse and during your period.  Cystadenoma cysts. ? Develop from cells on the outside surface of the ovary. ? Can get very large and cause lower abdomen pain and pain with intercourse. ? Can cause severe pain if they twist or break open (rupture).  Dermoid cysts. ? Are sometimes found in both ovaries. ? May contain different kinds of body tissue, such as skin, teeth, hair, or cartilage. ?  Usually do not cause symptoms unless they get very big.  Theca lutein cysts. ? Occur when too much of a certain hormone (human chorionic gonadotropin) is produced and overstimulates the ovaries to produce an egg. ? Are most common after having procedures used to assist with the conception of a baby (in vitro fertilization). What are the causes? Ovarian cysts may be caused by:  Ovarian hyperstimulation syndrome. This is a condition that can develop from taking fertility medicines. It causes multiple large ovarian cysts to form.  Polycystic ovarian syndrome (PCOS). This is a common hormonal disorder that can cause ovarian cysts, as well as problems with your period or fertility. What increases the risk? The following factors may make you more likely to develop ovarian cysts:  Being overweight or obese.  Taking fertility medicines.  Taking certain forms of hormonal birth control.  Smoking. What are the signs or symptoms? Many ovarian cysts do not cause symptoms. If symptoms are present, they may include:  Pelvic pain or pressure.  Pain in the lower abdomen.  Pain during sex.  Abdominal swelling.  Abnormal menstrual periods.  Increasing pain with menstrual periods. How is this diagnosed? These  cysts are commonly found during a routine pelvic exam. You may have tests to find out more about the cyst, such as:  Ultrasound.  X-ray of the pelvis.  CT scan.  MRI.  Blood tests. How is this treated? Many ovarian cysts go away on their own without treatment. Your health care provider may want to check your cyst regularly for 2-3 months to see if it changes. If you are in menopause, it is especially important to have your cyst monitored closely because menopausal women have a higher rate of ovarian cancer. When treatment is needed, it may include:  Medicines to help relieve pain.  A procedure to drain the cyst (aspiration).  Surgery to remove the whole cyst.  Hormone  treatment or birth control pills. These methods are sometimes used to help dissolve a cyst. Follow these instructions at home:  Take over-the-counter and prescription medicines only as told by your health care provider.  Do not drive or use heavy machinery while taking prescription pain medicine.  Get regular pelvic exams and Pap tests as often as told by your health care provider.  Return to your normal activities as told by your health care provider. Ask your health care provider what activities are safe for you.  Do not use any products that contain nicotine or tobacco, such as cigarettes and e-cigarettes. If you need help quitting, ask your health care provider.  Keep all follow-up visits as told by your health care provider. This is important. Contact a health care provider if:  Your periods are late, irregular, or painful, or they stop.  You have pelvic pain that does not go away.  You have pressure on your bladder or trouble emptying your bladder completely.  You have pain during sex.  You have any of the following in your abdomen: ? A feeling of fullness. ? Pressure. ? Discomfort. ? Pain that does not go away. ? Swelling.  You feel generally ill.  You become constipated.  You lose your appetite.  You develop severe acne.  You start to have more body hair and facial hair.  You are gaining weight or losing weight without changing your exercise and eating habits.  You think you may be pregnant. Get help right away if:  You have abdominal pain that is severe or gets worse.  You cannot eat or drink without vomiting.  You suddenly develop a fever.  Your menstrual period is much heavier than usual. This information is not intended to replace advice given to you by your health care provider. Make sure you discuss any questions you have with your health care provider. Document Released: 11/19/2005 Document Revised: 02/17/2018 Document Reviewed: 04/22/2016  Elsevier Patient Education  2020 [employer].

## 2019-09-30 ENCOUNTER — Telehealth: Payer: Self-pay

## 2019-09-30 DIAGNOSIS — N949 Unspecified condition associated with female genital organs and menstrual cycle: Secondary | ICD-10-CM

## 2019-09-30 DIAGNOSIS — N9489 Other specified conditions associated with female genital organs and menstrual cycle: Secondary | ICD-10-CM

## 2019-09-30 LAB — CA 125: Cancer Antigen (CA) 125: 15.5 U/mL (ref 0.0–38.1)

## 2019-09-30 NOTE — Telephone Encounter (Signed)
Spoke with patient. Results given. All questions answered. Patient verbalizes understanding. Order placed for pelvic MRI W WO contrast. Patient is aware this will be preauthorized and she will be contacted by [facility] directly to schedule the appointment. Patient is agreeable.   Cc: Corliss Blacker, RN  Routing to provider and will close encounter.

## 2019-09-30 NOTE — Telephone Encounter (Signed)
-----   Message from Salvadore Dom, MD sent at 09/30/2019 11:47 AM EDT ----- Please let her know that her CA 125 is normal and that we are setting her up for a MRI. Please set up a pelvic MRI for a complex right adnexal mass

## 2019-10-01 NOTE — Telephone Encounter (Signed)
Patient placed in [facility] hold.  

## 2019-10-05 ENCOUNTER — Other Ambulatory Visit: Payer: Self-pay | Admitting: Obstetrics and Gynecology

## 2019-10-08 ENCOUNTER — Telehealth: Payer: Self-pay | Admitting: Obstetrics and Gynecology

## 2019-10-08 NOTE — Telephone Encounter (Signed)
Patient is waiting to hear back regarding insurance approval for MRI scheduled 10/20/19.

## 2019-10-09 NOTE — Telephone Encounter (Signed)
Call to [employer] -spoke with [facility] and I gave her the authorization number and valid dates for MRI.

## 2019-10-09 NOTE — Telephone Encounter (Signed)
Spoke with Mila Merry. At [employer].   MRI pelvis w/wo contrast approved.  Authorization number [id number] Valid 10/09/19 -11/23/19  Patient notified of approval. Patient verbalizes understanding and is agreeable.   Routing to [place] to update [employer].

## 2019-10-09 NOTE — Telephone Encounter (Signed)
Call placed to [employer] to obtain prior authorization for MRI. Case [id number] please call [phone] to give clinicals. Appointment scheduled 10/20/19 at [facility].

## 2019-10-09 NOTE — Telephone Encounter (Signed)
Returned call to patient regarding MRI. Patient is aware we are working on getting the approval for MRI. Patient has more questions concerning regarding getting this approved. Message to triage to help with clinicals for approval.

## 2019-10-20 ENCOUNTER — Other Ambulatory Visit: Payer: Self-pay

## 2019-10-20 ENCOUNTER — Ambulatory Visit
Admission: RE | Admit: 2019-10-20 | Discharge: 2019-10-20 | Disposition: A | Discharge status: Home / Self Care | Payer: 59 | Source: Ambulatory Visit | Attending: Obstetrics and Gynecology | Admitting: Obstetrics and Gynecology

## 2019-10-20 DIAGNOSIS — N9489 Other specified conditions associated with female genital organs and menstrual cycle: Secondary | ICD-10-CM

## 2019-10-20 MED ORDER — GADOBENATE DIMEGLUMINE 529 MG/ML IV SOLN
12.0000 mL | Freq: Once | INTRAVENOUS | Status: AC | PRN
  Administered 2019-10-20: 12 mL via INTRAVENOUS

## 2019-10-22 ENCOUNTER — Other Ambulatory Visit: Payer: Self-pay | Admitting: Obstetrics and Gynecology

## 2019-10-22 DIAGNOSIS — N83201 Unspecified ovarian cyst, right side: Secondary | ICD-10-CM

## 2019-11-05 ENCOUNTER — Other Ambulatory Visit: Payer: Self-pay

## 2019-11-05 ENCOUNTER — Telehealth: Payer: Self-pay | Admitting: Obstetrics and Gynecology

## 2019-11-05 NOTE — Telephone Encounter (Signed)
Patient calling to schedule follow up ultrasound.

## 2019-11-05 NOTE — Telephone Encounter (Signed)
Per review of Epic, patient to return mid December 2020 for f/u PUS for complex right ovarian cyst.   Call to patient, PUS scheduled for 12/8 at 3:00pm, consult at 3:30pm with Dr. Talbert [name]. Order previously placed for precert. Patient is agreeable to date and time.   Routing to provider for final review. Patient is agreeable to disposition. Will close encounter.   Cc: Magdalene Patricia, [address] [employer]

## 2019-11-09 NOTE — Progress Notes (Deleted)
GYNECOLOGY  VISIT   HPI: 53 y.o.   Married White or Caucasian Not Hispanic or Latino  female   [phone] with No LMP recorded. (Menstrual status: Irregular Periods).   here for     GYNECOLOGIC HISTORY: No LMP recorded. (Menstrual status: Irregular Periods). Contraception:*** Menopausal hormone therapy: ***        OB History    Gravida  2   Para  2   Term  2   Preterm      AB      Living  2     SAB      TAB      Ectopic      Multiple      Live Births  2              Patient Active Problem List   Diagnosis Date Noted  . Chronic back pain   . Lumbar radiculopathy 09/12/2018    Past Medical History:  Diagnosis Date  . Chronic back pain   . History of cyst of breast   . History of ovarian cyst   . History of recurrent UTIs   . Migraine with aura   . Raynaud disease   . Ruptured lumbar intervertebral disc    3     Past Surgical History:  Procedure Laterality Date  . CHOLECYSTECTOMY     2009  . KNEE SURGERY     right knee, 2011    Current Outpatient Medications  Medication Sig Dispense Refill  . ciclopirox (PENLAC) 8 % solution Apply 1 application topically daily.    Marland Kitchen loratadine (CLARITIN) 10 MG tablet Take 1 tablet by mouth daily.    . medroxyPROGESTERone (PROVERA) 5 MG tablet 1 tablet po q day x 5 days every other month if no spontaneous menses (Patient not taking: Reported on 09/29/2019) 15 tablet 1  . nitrofurantoin (MACRODANTIN) 50 MG capsule 1 tablet po prn intercourse 30 capsule 2   No current facility-administered medications for this visit.      ALLERGIES: Ciprofloxacin, Milk-related compounds, and Prednisone  Family History  Problem Relation Age of Onset  . Osteoporosis Mother   . Stroke Mother   . Migraines Mother   . Myelodysplastic syndrome Father   . Heart disease Father   . Bladder Cancer Paternal Uncle   . Stroke Maternal Grandmother   . Stroke Paternal Grandmother   . Lung cancer Maternal Grandfather     Social  History   Socioeconomic History  . Marital status: Married    Spouse name: Not on file  . Number of children: Not on file  . Years of education: Not on file  . Highest education level: Not on file  Occupational History  . Not on file  Social Needs  . Financial resource strain: Not on file  . Food insecurity    Worry: Not on file    Inability: Not on file  . Transportation needs    Medical: Not on file    Non-medical: Not on file  Tobacco Use  . Smoking status: Never Smoker  . Smokeless tobacco: Never Used  Substance and Sexual Activity  . Alcohol use: Yes    Alcohol/week: 2.0 standard drinks    Types: 2 Glasses of wine per week  . Drug use: Never  . Sexual activity: Yes    Birth control/protection: Condom  Lifestyle  . Physical activity    Days per week: Not on file    Minutes per session: Not on file  .  Stress: Not on file  Relationships  . Social Herbalist on phone: Not on file    Gets together: Not on file    Attends religious service: Not on file    Active member of club or organization: Not on file    Attends meetings of clubs or organizations: Not on file    Relationship status: Not on file  . Intimate partner violence    Fear of current or ex partner: Not on file    Emotionally abused: Not on file    Physically abused: Not on file    Forced sexual activity: Not on file  Other Topics Concern  . Not on file  Social History Narrative  . Not on file    ROS  PHYSICAL EXAMINATION:    There were no vitals taken for this visit.    General appearance: alert, cooperative and appears stated age Neck: no adenopathy, supple, symmetrical, trachea midline and thyroid {CHL AMB PHY EX THYROID NORM DEFAULT:[phone]::"normal to inspection and palpation"} Breasts: {Exam; breast:13139::"normal appearance, no masses or tenderness"} Abdomen: soft, non-tender; non distended, no masses,  no organomegaly  Pelvic: External genitalia:  no lesions               Urethra:  normal appearing urethra with no masses, tenderness or lesions              Bartholins and Skenes: normal                 Vagina: normal appearing vagina with normal color and discharge, no lesions              Cervix: {CHL AMB PHY EX CERVIX NORM [phone]::"no lesions"}              Bimanual Exam:  Uterus:  {CHL AMB PHY EX UTERUS NORM DEFAULT:[phone]::"normal size, contour, position, consistency, mobility, non-tender"}              Adnexa: {CHL AMB PHY EX ADNEXA NO MASS DEFAULT:[phone]::"no mass, fullness, tenderness"}              Rectovaginal: {yes no:[id number]}.  Confirms.              Anus:  normal sphincter tone, no lesions  Chaperone was present for exam.  ASSESSMENT     PLAN    An After Visit Summary was printed and given to the patient.  *** minutes face to face time of which over 50% was spent in counseling.

## 2019-11-10 ENCOUNTER — Telehealth: Payer: Self-pay | Admitting: Obstetrics and Gynecology

## 2019-11-10 ENCOUNTER — Other Ambulatory Visit: Payer: 59

## 2019-11-10 ENCOUNTER — Other Ambulatory Visit: Payer: 59 | Admitting: Obstetrics and Gynecology

## 2019-11-10 NOTE — Telephone Encounter (Signed)
Spoke with pt. Pt exposed to +Covid contact and is on day 8 from exposure. Pt denies any Covid sx. PUS rescheduled from today to 11/17/19 at 2:00pm. Pt agreeable.   Will route to Dr Talbert [name] for review. Will close encounter.   Cc: Suzy -precert moved from [id number] to 11/17/19. Orders in place.

## 2019-11-10 NOTE — Telephone Encounter (Signed)
Patient cancelled this afternoon's ultrasound. Found out last night that she has been in contact with someone covid + and is going to get tested today. Will call back to reschedule.

## 2019-11-17 ENCOUNTER — Ambulatory Visit (INDEPENDENT_AMBULATORY_CARE_PROVIDER_SITE_OTHER): Payer: 59

## 2019-11-17 ENCOUNTER — Encounter: Payer: Self-pay | Admitting: Obstetrics and Gynecology

## 2019-11-17 ENCOUNTER — Other Ambulatory Visit: Payer: Self-pay | Admitting: *Deleted

## 2019-11-17 ENCOUNTER — Telehealth: Payer: Self-pay | Admitting: *Deleted

## 2019-11-17 ENCOUNTER — Other Ambulatory Visit: Payer: Self-pay

## 2019-11-17 ENCOUNTER — Ambulatory Visit: Payer: 59 | Admitting: Obstetrics and Gynecology

## 2019-11-17 VITALS — BP 124/80 | HR 64 | Temp 97.3°F | Wt 135.6 lb

## 2019-11-17 DIAGNOSIS — R5383 Other fatigue: Secondary | ICD-10-CM

## 2019-11-17 DIAGNOSIS — N951 Menopausal and female climacteric states: Secondary | ICD-10-CM | POA: Diagnosis not present

## 2019-11-17 DIAGNOSIS — N939 Abnormal uterine and vaginal bleeding, unspecified: Secondary | ICD-10-CM

## 2019-11-17 DIAGNOSIS — N83201 Unspecified ovarian cyst, right side: Secondary | ICD-10-CM

## 2019-11-17 DIAGNOSIS — N949 Unspecified condition associated with female genital organs and menstrual cycle: Secondary | ICD-10-CM

## 2019-11-17 NOTE — Progress Notes (Signed)
GYNECOLOGY  VISIT   HPI: 53 y.o.   Married White or Caucasian Not Hispanic or Latino  female   [phone] with No LMP recorded. (Menstrual status: Irregular Periods).   here for f/u of a complex right ovarian cyst.  She had an ultrasound on 09/29/19 for evaluation of pain and was noted to have a complex 4.4 x 3.4 cm cyst in the right ovary with nodularity with flow. CA 125 was 15. An MRI was order to plan best course of action. The MRI (10/20/19)  read the right ovarian lesion as simple and cystic, no enhancing solid components or area's of complexity were noted.  Cycles 8/24, 9/14, 10/8, then 11/1, 11/19 and 12/7. She went 110 days without a cycle in the summer.   She c/o fatigue, mild constipation.   GYNECOLOGIC HISTORY: No LMP recorded. (Menstrual status: Irregular Periods). Contraception:condoms Menopausal hormone therapy: cyclic provera        OB History    Gravida  2   Para  2   Term  2   Preterm      AB      Living  2     SAB      TAB      Ectopic      Multiple      Live Births  2              Patient Active Problem List   Diagnosis Date Noted  . Chronic back pain   . Lumbar radiculopathy 09/12/2018    Past Medical History:  Diagnosis Date  . Chronic back pain   . History of cyst of breast   . History of ovarian cyst   . History of recurrent UTIs   . Migraine with aura   . Raynaud disease   . Ruptured lumbar intervertebral disc    3     Past Surgical History:  Procedure Laterality Date  . CHOLECYSTECTOMY     2009  . KNEE SURGERY     right knee, 2011    Current Outpatient Medications  Medication Sig Dispense Refill  . ciclopirox (PENLAC) 8 % solution Apply 1 application topically daily.    Marland Kitchen loratadine (CLARITIN) 10 MG tablet Take 1 tablet by mouth daily.    . medroxyPROGESTERone (PROVERA) 5 MG tablet 1 tablet po q day x 5 days every other month if no spontaneous menses (Patient not taking: Reported on 09/29/2019) 15 tablet 1  .  nitrofurantoin (MACRODANTIN) 50 MG capsule 1 tablet po prn intercourse 30 capsule 2   No current facility-administered medications for this visit.     ALLERGIES: Ciprofloxacin, Milk-related compounds, and Prednisone  Family History  Problem Relation Age of Onset  . Osteoporosis Mother   . Stroke Mother   . Migraines Mother   . Myelodysplastic syndrome Father   . Heart disease Father   . Bladder Cancer Paternal Uncle   . Stroke Maternal Grandmother   . Stroke Paternal Grandmother   . Lung cancer Maternal Grandfather     Social History   Socioeconomic History  . Marital status: Married    Spouse name: Not on file  . Number of children: Not on file  . Years of education: Not on file  . Highest education level: Not on file  Occupational History  . Not on file  Tobacco Use  . Smoking status: Never Smoker  . Smokeless tobacco: Never Used  Substance and Sexual Activity  . Alcohol use: Yes    Alcohol/week:  2.0 standard drinks    Types: 2 Glasses of wine per week  . Drug use: Never  . Sexual activity: Yes    Birth control/protection: Condom  Other Topics Concern  . Not on file  Social History Narrative  . Not on file   Social Determinants of Health   Financial Resource Strain:   . Difficulty of Paying Living Expenses: Not on file  Food Insecurity:   . Worried About Charity fundraiser in the Last Year: Not on file  . Ran Out of Food in the Last Year: Not on file  Transportation Needs:   . Lack of Transportation (Medical): Not on file  . Lack of Transportation (Non-Medical): Not on file  Physical Activity:   . Days of Exercise per Week: Not on file  . Minutes of Exercise per Session: Not on file  Stress:   . Feeling of Stress : Not on file  Social Connections:   . Frequency of Communication with Friends and Family: Not on file  . Frequency of Social Gatherings with Friends and Family: Not on file  . Attends Religious Services: Not on file  . Active Member of  Clubs or Organizations: Not on file  . Attends Archivist Meetings: Not on file  . Marital Status: Not on file  Intimate Partner Violence:   . Fear of Current or Ex-Partner: Not on file  . Emotionally Abused: Not on file  . Physically Abused: Not on file  . Sexually Abused: Not on file    Review of Systems  Constitutional: Positive for malaise/fatigue.  Gastrointestinal: Positive for constipation.  All other systems reviewed and are negative.   PHYSICAL EXAMINATION:    There were no vitals taken for this visit.    General appearance: alert, cooperative and appears stated age  Chaperone was present for exam.  I was present during the ultrasound. Images reviewed with the patient. She has a 4.4 cm complex right adnexal cyst, coming off of or right next to her right ovary. There is some wall nodularity, appears to have slight flow.   ASSESSMENT Complex right adnexal cyst, some wall nodularity with slight blood flow. CA 125 was normal on 09/29/19. MRI read the cyst out as simple and benign, but the abnormality persists on ultrasound.  Abnormal uterine bleeding for the last few months, likely perimenopausal.     PLAN Will refer to GYN Oncology for a consultation, to see if they feel it is okay for me to do her surgery or if they should do it. We discussed at minimum a diagnostic laparoscopy, removal of the adnexal cyst, both tubes and likely her right ovary. Further plans depending on recommendation from GYN Oncology. Will plan minimum of endometrial biopsy vs D&C for AUB at the time of her other surgery. TSH today    ~15 minutes face to face time of which over 50% was spent in counseling.

## 2019-11-17 NOTE — Telephone Encounter (Signed)
Spoke with Sharyn Lull at [employer].  Requested OV for consult with Dr. Denman George. Patient seen in office today for f/u of right ovarian cyst. MRI of pelvis on 10/20/19. PUS today, complex right adnexal cyst, nodularity and blood flow. Dr. Talbert [name] request Dr. Denman George review today's PUS images.   Dr. Denman George will review, her office will f/u today or in the morning with recommendations and appt details.   Patient aware of plan.   Referral placed.

## 2019-11-18 ENCOUNTER — Encounter: Payer: Self-pay | Admitting: Obstetrics and Gynecology

## 2019-11-18 LAB — TSH: TSH: 2.01 u[IU]/mL (ref 0.450–4.500)

## 2019-11-18 NOTE — Telephone Encounter (Signed)
Spoke with Sharyn Lull at [facility]. Dr. Denman George will be contacting Dr. Talbert [name] directly to further discuss.   Routing to Dr. Rosann Auerbach.

## 2019-11-19 NOTE — Telephone Encounter (Signed)
I have not heard from Dr Denman George yet.

## 2019-11-19 NOTE — Telephone Encounter (Signed)
I have sent a message requesting an update, no response yet.

## 2019-11-19 NOTE — Telephone Encounter (Signed)
Patient is calling for an update regarding her referral to [facility].

## 2019-11-19 NOTE — Telephone Encounter (Signed)
Spoke with patient, provided update. Waiting for recommendations from Dr. Denman George. Our office will provide update once Dr. Talbert [name] has reviewed them. Patient verbalizes understanding.

## 2019-11-19 NOTE — Telephone Encounter (Signed)
Patient sent the following correspondence through [facility].  I noticed in mychart that the ultrasound report is missing page 2 of 3. Is this going to be a problem for Dr. Denman George to provide an opinion?  Page 2 has the information on my right ovary, which is the ovary in question.  Thanks, [employer] 

## 2019-11-19 NOTE — Telephone Encounter (Signed)
Dr. Talbert [name] -have you consulted with Dr. Denman George?

## 2019-11-20 NOTE — Telephone Encounter (Signed)
Reviewed with Dr. Talbert [name], call placed to patient. Advised ok to proceed with surgery with Dr. Talbert [name]. Advised patient I will forward to practice administrator for return call to discuss surgery scheduling and the business office to review benefits. Patient verbalizes understanding and is agreeable to plan.   Routing to [employer] and Lamont Snowball, Therapist, sports.

## 2019-11-20 NOTE — Telephone Encounter (Signed)
Rosa -ok to cancel pending referral to Dr. Denman George, [place].

## 2019-11-22 NOTE — Telephone Encounter (Signed)
Please schedule the patient for a diagnostic laparoscopy, RSO, left salpingectomy, possible LSO.

## 2019-11-24 ENCOUNTER — Telehealth: Payer: Self-pay | Admitting: Obstetrics and Gynecology

## 2019-11-24 NOTE — Telephone Encounter (Signed)
Patient calling to discuss surgery scheduling. 

## 2019-11-25 NOTE — Telephone Encounter (Signed)
Scheduled for 12-08-19 per patient request. See next phone message.

## 2019-11-25 NOTE — Telephone Encounter (Signed)
Patient calling for Susan Leonard to discuss surgery scheduling.

## 2019-11-25 NOTE — Telephone Encounter (Signed)
Return call to patient at [phone]. Reviewed surgery options and dates.  Discussed business office need to confirm benefits for 2021 and potentially precert surgery. Patient desires to proceed with case at earliest possible date.  States she will call insurance company personally and she has self funded plan that should move quicker. Advised will schedule for 12-08-19 as requested and consult scheduled for 12-01-19 as patient would like to discuss possible TLH due to fibroids and urinary incontinence issues. Advised will confirm surgery information on Monday 11-30-19.

## 2019-11-30 ENCOUNTER — Other Ambulatory Visit: Payer: Self-pay

## 2019-11-30 ENCOUNTER — Encounter: Payer: Self-pay | Admitting: Obstetrics and Gynecology

## 2019-11-30 ENCOUNTER — Telehealth: Payer: Self-pay | Admitting: Obstetrics and Gynecology

## 2019-11-30 NOTE — Telephone Encounter (Signed)
She is perimenopausal, her fibroids are not very big and will shrink in menopause. We can discuss her options when she comes in tomorrow.  Urinary incontinence would need further evaluation prior to surgery. We haven't discussed her incontinence. If she has symptoms of significant GSI she would need urodynamic studies prior to surgery.   She has an appointment tomorrow, will further discuss.

## 2019-11-30 NOTE — Telephone Encounter (Signed)
Patient sent the following correspondence through [facility].  Prior to pre-op I am providing health history of chronic low back pain.  1. sharp nerve pain shooting though hips, down legs, pins/needles in L foot. 2. dull ache + pelvic fullness/aching and urinary freq, urgency, leakage.  I have disc protrusion at L3-4, L4-5, L5-S1 . Chronic pain w/sciatica, hip pain and generalized LB pain.  2 years ago  significant increase in this pain. Dr. Newman Pies suggested steroid injection with caveat of 50/50 efficacy. Did PT 4 mos w/signif pain reduct and stop advil 3x daily. Continue PT ex daily. Fall 2014  first episode of intense low back ache + pelvic pain. It lasted ~5 mos .  An Obgyn ordered a hysterectomy but because he was not able to give a specific diagnosis  ("because you are having pain") I did not have it. I have multiple episodes each year. PT ex does not relieve. In the past year also have bladder leakage when I have this pain. Could this be caused by the subserosal fibroids  and if so, will my upcoming surgery be able to address?

## 2019-11-30 NOTE — Progress Notes (Signed)
GYNECOLOGY  VISIT   HPI: 53 y.o.   Married White or Caucasian Not Hispanic or Latino  female   [phone] with Patient's last menstrual period was 11/09/2019 (exact date).   Here to discuss surgery.   The patient was seen in mid October c/o a 6-8 week h/o intermittent stabbing pain in her RLQ/pelvis. On ultrasound she had a complex 4.4 x 3.4 cm right adnexal cyst with nodularity and blood flow. She was also noted to have a fibroid uterus, 12 fibroids were measured (ranged from 1.2-4.2 cm in size). CA 125 was 15. An MRI was done and the right ovarian lesion was reported as simple and cystic without enhancing solid components or areas of complexity.  A repeat ultrasound was performed 6 weeks later and the cystic mass in the right adnexa was stable, still with nodularity and vascularity. Dr Denman George (GYN Oncologist) reviewed the images and agreed that the cystic mass should be removed, but didn't feel it needed to be done by Oncology.   The patient has a several year h/o intermittent pelvic and lower back pain. She always gets the pain prior to her cycle, but also gets it randomly throughout the month. She describes the pain as a dull ache, it varies in intensity. She is asking about hysterectomy. She has reported normal BM's daily. She does have herniated disc's and has some shooting pain down her legs.   The patient has had some abnormal perimenopausal bleeding in the last few months. Endometrial stripe was 3.96 cm and uniform on 11/17/19.  She c/o intermittent mixed incontinence, urge is worse than stress incontinence. Can void up to 15 x a day. She leaks a couple of times a day. She has urgency to void. She mostly leaks a small amount, can leak a large amount (only with urge incontinence).  She has ~20 oz of coffee day. The stress incontinence is minimal.   GYNECOLOGIC HISTORY: Patient's last menstrual period was 11/09/2019 (exact date). Contraception: Condoms Menopausal hormone therapy: None         OB History    Gravida  2   Para  2   Term  2   Preterm      AB      Living  2     SAB      TAB      Ectopic      Multiple      Live Births  2              Patient Active Problem List   Diagnosis Date Noted  . Chronic back pain   . Lumbar radiculopathy 09/12/2018    Past Medical History:  Diagnosis Date  . Chronic back pain   . Herniated lumbar intervertebral disc   . History of cyst of breast   . History of ovarian cyst   . History of recurrent UTIs   . Migraine with aura   . Nerve pain   . Raynaud disease   . Ruptured lumbar intervertebral disc    3   . Sciatica     Past Surgical History:  Procedure Laterality Date  . CHOLECYSTECTOMY     2009  . KNEE SURGERY     right knee, 2011  . TONSILLECTOMY      Current Outpatient Medications  Medication Sig Dispense Refill  . ciclopirox (PENLAC) 8 % solution Apply 1 application topically daily.    Marland Kitchen loratadine (CLARITIN) 10 MG tablet Take 1 tablet by mouth daily.    Marland Kitchen  nitrofurantoin (MACRODANTIN) 50 MG capsule 1 tablet po prn intercourse 30 capsule 2  . medroxyPROGESTERone (PROVERA) 5 MG tablet 1 tablet po q day x 5 days every other month if no spontaneous menses (Patient not taking: Reported on 12/01/2019) 15 tablet 1   No current facility-administered medications for this visit.     ALLERGIES: Ciprofloxacin, Milk-related compounds, and Prednisone  Family History  Problem Relation Age of Onset  . Osteoporosis Mother   . Stroke Mother   . Migraines Mother   . Myelodysplastic syndrome Father   . Heart disease Father   . Bladder Cancer Paternal Uncle   . Stroke Maternal Grandmother   . Stroke Paternal Grandmother   . Lung cancer Maternal Grandfather     Social History   Socioeconomic History  . Marital status: Married    Spouse name: Not on file  . Number of children: Not on file  . Years of education: Not on file  . Highest education level: Not on file  Occupational History  . Not on  file  Tobacco Use  . Smoking status: Never Smoker  . Smokeless tobacco: Never Used  Substance and Sexual Activity  . Alcohol use: Yes    Alcohol/week: 2.0 standard drinks    Types: 2 Glasses of wine per week  . Drug use: Never  . Sexual activity: Yes    Birth control/protection: Condom  Other Topics Concern  . Not on file  Social History Narrative  . Not on file   Social Determinants of Health   Financial Resource Strain:   . Difficulty of Paying Living Expenses: Not on file  Food Insecurity:   . Worried About Charity fundraiser in the Last Year: Not on file  . Ran Out of Food in the Last Year: Not on file  Transportation Needs:   . Lack of Transportation (Medical): Not on file  . Lack of Transportation (Non-Medical): Not on file  Physical Activity:   . Days of Exercise per Week: Not on file  . Minutes of Exercise per Session: Not on file  Stress:   . Feeling of Stress : Not on file  Social Connections:   . Frequency of Communication with Friends and Family: Not on file  . Frequency of Social Gatherings with Friends and Family: Not on file  . Attends Religious Services: Not on file  . Active Member of Clubs or Organizations: Not on file  . Attends Archivist Meetings: Not on file  . Marital Status: Not on file  Intimate Partner Violence:   . Fear of Current or Ex-Partner: Not on file  . Emotionally Abused: Not on file  . Physically Abused: Not on file  . Sexually Abused: Not on file    Review of Systems  Constitutional: Negative.   HENT: Negative.   Eyes: Negative.   Respiratory: Negative.   Cardiovascular: Negative.   Gastrointestinal: Negative.   Genitourinary:       Pelvic pain  Musculoskeletal: Positive for back pain.  Skin: Negative.   Neurological: Negative.   Endo/Heme/Allergies: Negative.   Psychiatric/Behavioral: Negative.   She has pain that radiates down her legs from herniated disc's.  PHYSICAL EXAMINATION:    BP 118/80 (BP  Location: Right Arm, Patient Position: Sitting, Cuff Size: Normal)   Pulse 72   Temp (!) 96.8 F (36 C) (Skin)   Wt 137 lb 3.2 oz (62.2 kg)   LMP 11/09/2019 (Exact Date)     General appearance: alert, cooperative and appears  stated age Neck: no adenopathy, supple, symmetrical, trachea midline and thyroid normal to inspection and palpation Heart: regular rate and rhythm Lungs: CTAB Abdomen: soft, non-tender; bowel sounds normal; no masses,  no organomegaly Extremities: normal, atraumatic, no cyanosis Skin: normal color, texture and turgor, no rashes or lesions Lymph: normal cervical supraclavicular and inguinal nodes Neurologic: grossly normal   ASSESSMENT Complex right adnexal mass Fibroid uterus Pelvic pain Mixed incontinence, Urge>>stress    PLAN We discussed the option of laparoscopy with removal of the right adnexal mass, bilateral salpingectomy vs TLH/removal of adnexal mass and bilateral salpingectomy She understands that she may still continue to have pain after hysterectomy. She wants to proceed with hysterectomy Discussed total laparoscopic hysterectomy, bilateral salpingectomy, removal of right adnexal mass (possible oophorectomy) and cystoscopy. Reviewed the risks of the procedure, including infection, bleeding, damage to bowel/badder/vessels/ureters.  Discussed the possible need for laparotomy. Discussed post operative recovery and risk of cuff dehiscence. All of her questions were answered Recommended she cut back on caffeine, discussed kegels and pelvic floor PT. We discussed the option of surgery for GSI, she would need urodynamics and it could worsen her urge incontinence. She declines surgery for the GSI    An After Visit Summary was printed and given to the patient.  Over 25 minutes face to face time of which over 50% was spent in counseling.

## 2019-11-30 NOTE — Telephone Encounter (Signed)
Reviewed with Dr.Jertson. Heath history updated. Encounter closed.

## 2019-11-30 NOTE — Telephone Encounter (Signed)
Patient is scheduled for surgery consult tomorrow to discuss surgical plan and options.  Routing to Section to update records.

## 2019-12-01 ENCOUNTER — Encounter (HOSPITAL_BASED_OUTPATIENT_CLINIC_OR_DEPARTMENT_OTHER): Payer: Self-pay | Admitting: Obstetrics and Gynecology

## 2019-12-01 ENCOUNTER — Ambulatory Visit (INDEPENDENT_AMBULATORY_CARE_PROVIDER_SITE_OTHER): Payer: 59 | Admitting: Obstetrics and Gynecology

## 2019-12-01 ENCOUNTER — Other Ambulatory Visit: Payer: Self-pay

## 2019-12-01 ENCOUNTER — Encounter: Payer: Self-pay | Admitting: Obstetrics and Gynecology

## 2019-12-01 ENCOUNTER — Telehealth: Payer: Self-pay | Admitting: Obstetrics and Gynecology

## 2019-12-01 VITALS — BP 118/80 | HR 72 | Temp 96.8°F | Wt 137.2 lb

## 2019-12-01 DIAGNOSIS — N949 Unspecified condition associated with female genital organs and menstrual cycle: Secondary | ICD-10-CM

## 2019-12-01 DIAGNOSIS — R102 Pelvic and perineal pain: Secondary | ICD-10-CM

## 2019-12-01 DIAGNOSIS — N3946 Mixed incontinence: Secondary | ICD-10-CM | POA: Diagnosis not present

## 2019-12-01 DIAGNOSIS — D259 Leiomyoma of uterus, unspecified: Secondary | ICD-10-CM | POA: Diagnosis not present

## 2019-12-01 NOTE — Telephone Encounter (Signed)
Spoke to pt. Pt given surgery instructions and set up 1 and 4 week post op appts Pt agreeable.   Will route to Dr Talbert [name] for review and will close encounter.   CC: Deloris Ping and Gay Filler

## 2019-12-01 NOTE — Telephone Encounter (Signed)
Please contact patient while I am out of office. Case posting has been updated to reflect [place] as ashe discussed with Dr Talbert [name] today.  Deloris Ping has initiated precert. She will follow-up with them tomorrow.  She will need surgery instruction sheet and post op appointments for 1 and 4 week post op.

## 2019-12-01 NOTE — Progress Notes (Signed)
Spoke with patient via telephone for pre op interview. NPO after MN. Patient will need UPT AM of surgery. No medications AM of surgery. Arrival time 0730.

## 2019-12-01 NOTE — Patient Instructions (Signed)
Kegel Exercises  Kegel exercises can help strengthen your pelvic floor muscles. The pelvic floor is a group of muscles that support your rectum, small intestine, and bladder. In females, pelvic floor muscles also help support the womb (uterus). These muscles help you control the flow of urine and stool. Kegel exercises are painless and simple, and they do not require any equipment. Your provider may suggest Kegel exercises to:  Improve bladder and bowel control.  Improve sexual response.  Improve weak pelvic floor muscles after surgery to remove the uterus (hysterectomy) or pregnancy (females).  Improve weak pelvic floor muscles after prostate gland removal or surgery (males). Kegel exercises involve squeezing your pelvic floor muscles, which are the same muscles you squeeze when you try to stop the flow of urine or keep from passing gas. The exercises can be done while sitting, standing, or lying down, but it is best to vary your position. Exercises How to do Kegel exercises: 1. Squeeze your pelvic floor muscles tight. You should feel a tight lift in your rectal area. If you are a female, you should also feel a tightness in your vaginal area. Keep your stomach, buttocks, and legs relaxed. 2. Hold the muscles tight for up to 10 seconds. 3. Breathe normally. 4. Relax your muscles. 5. Repeat as told by your health care provider. Repeat this exercise daily as told by your health care provider. Continue to do this exercise for at least 4-6 weeks, or for as long as told by your health care provider. You may be referred to a physical therapist who can help you learn more about how to do Kegel exercises. Depending on your condition, your health care provider may recommend:  Varying how long you squeeze your muscles.  Doing several sets of exercises every day.  Doing exercises for several weeks.  Making Kegel exercises a part of your regular exercise routine. This information is not intended  to replace advice given to you by your health care provider. Make sure you discuss any questions you have with your health care provider. Document Released: 11/05/2012 Document Revised: 07/09/2018 Document Reviewed: 07/09/2018 Elsevier Patient Education  [employer]. Urinary Incontinence  Urinary incontinence refers to a condition in which a person is unable to control where and when to pass urine. A person with this condition will urinate when he or she does not mean to (involuntarily). What are the causes? This condition may be caused by:  Medicines.  Infections.  Constipation.  Overactive bladder muscles.  Weak bladder muscles.  Weak pelvic floor muscles. These muscles provide support for the bladder, intestine, and, in women, the uterus.  Enlarged prostate in men. The prostate is a gland near the bladder. When it gets too big, it can pinch the urethra. With the urethra blocked, the bladder can weaken and lose the ability to empty properly.  Surgery.  Emotional factors, such as anxiety, stress, or post-traumatic stress disorder (PTSD).  Pelvic organ prolapse. This happens in women when organs shift out of place and into the vagina. This shift can prevent the bladder and urethra from working properly. What increases the risk? The following factors may make you more likely to develop this condition:  Older age.  Obesity and physical inactivity.  Pregnancy and childbirth.  Menopause.  Diseases that affect the nerves or spinal cord (neurological diseases).  Long-term (chronic) coughing. This can increase pressure on the bladder and pelvic floor muscles. What are the signs or symptoms? Symptoms may vary depending on the type  of urinary incontinence you have. They include:  A sudden urge to urinate, but passing urine involuntarily before you can get to a bathroom (urge incontinence).  Suddenly passing urine with any activity that forces urine to pass, such as  coughing, laughing, exercise, or sneezing (stress incontinence).  Needing to urinate often, but urinating only a small amount, or constantly dribbling urine (overflow incontinence).  Urinating because you cannot get to the bathroom in time due to a physical disability, such as arthritis or injury, or communication and thinking problems, such as Alzheimer disease (functional incontinence). How is this diagnosed? This condition may be diagnosed based on:  Your medical history.  A physical exam.  Tests, such as: ? Urine tests. ? X-rays of your kidney and bladder. ? Ultrasound. ? CT scan. ? Cystoscopy. In this procedure, a health care provider inserts a tube with a light and camera (cystoscope) through the urethra and into the bladder in order to check for problems. ? Urodynamic testing. These tests assess how well the bladder, urethra, and sphincter can store and release urine. There are different types of urodynamic tests, and they vary depending on what the test is measuring. To help diagnose your condition, your health care provider may recommend that you keep a log of when you urinate and how much you urinate. How is this treated? Treatment for this condition depends on the type of incontinence that you have and its cause. Treatment may include:  Lifestyle changes, such as: ? Quitting smoking. ? Maintaining a healthy weight. ? Staying active. Try to get 150 minutes of moderate-intensity exercise every week. Ask your health care provider which activities are safe for you. ? Eating a healthy diet.  Avoid high-fat foods, like fried foods.  Avoid refined carbohydrates like white bread and white rice.  Limit how much alcohol and caffeine you drink.  Increase your fiber intake. Foods such as fresh fruits, vegetables, beans, and whole grains are healthy sources of fiber.  Pelvic floor muscle exercises.  Bladder training, such as lengthening the amount of time between bathroom breaks,  or using the bathroom at regular intervals.  Using techniques to suppress bladder urges. This can include distraction techniques or controlled breathing exercises.  Medicines to relax the bladder muscles and prevent bladder spasms.  Medicines to help slow or prevent the growth of a man's prostate.  Botox injections. These can help relax the bladder muscles.  Using pulses of electricity to help change bladder reflexes (electrical nerve stimulation).  For women, using a medical device to prevent urine leaks. This is a small, tampon-like, disposable device that is inserted into the urethra.  Injecting collagen or carbon beads (bulking agents) into the urinary sphincter. These can help thicken tissue and close the bladder opening.  Surgery. Follow these instructions at home: Lifestyle  Limit alcohol and caffeine. These can fill your bladder quickly and irritate it.  Keep yourself clean to help prevent odors and skin damage. Ask your doctor about special skin creams and cleansers that can protect the skin from urine.  Consider wearing pads or adult diapers. Make sure to change them regularly, and always change them right after experiencing incontinence. General instructions  Take over-the-counter and prescription medicines only as told by your health care provider.  Use the bathroom about every 3-4 hours, even if you do not feel the need to urinate. Try to empty your bladder completely every time. After urinating, wait a minute. Then try to urinate again.  Make sure you are in  a relaxed position while urinating.  If your incontinence is caused by nerve problems, keep a log of the medicines you take and the times you go to the bathroom.  Keep all follow-up visits as told by your health care provider. This is important. Contact a health care provider if:  You have pain that gets worse.  Your incontinence gets worse. Get help right away if:  You have a fever or chills.  You are  unable to urinate.  You have redness in your groin area or down your legs. Summary  Urinary incontinence refers to a condition in which a person is unable to control where and when to pass urine.  This condition may be caused by medicines, infection, weak bladder muscles, weak pelvic floor muscles, enlargement of the prostate (in men), or surgery.  The following factors increase your risk for developing this condition: older age, obesity, pregnancy and childbirth, menopause, neurological diseases, and chronic coughing.  There are several types of urinary incontinence. They include urge incontinence, stress incontinence, overflow incontinence, and functional incontinence.  This condition is usually treated first with lifestyle and behavioral changes, such as quitting smoking, eating a healthier diet, and doing regular pelvic floor exercises. Other treatment options include medicines, bulking agents, medical devices, electrical nerve stimulation, or surgery. This information is not intended to replace advice given to you by your health care provider. Make sure you discuss any questions you have with your health care provider. Document Released: 12/27/2004 Document Revised: 11/29/2017 Document Reviewed: 02/28/2017 Elsevier Patient Education  2020 [employer].

## 2019-12-01 NOTE — H&P (Signed)
GYNECOLOGY VISIT  HPI:  53 y.o. Married White or Caucasian Not Hispanic or Latino female  [phone] with Patient's last menstrual period was 11/09/2019 (exact date).  Here to discuss surgery.  The patient was seen in mid October c/o a 6-8 week h/o intermittent stabbing pain in her RLQ/pelvis. On ultrasound she had a complex 4.4 x 3.4 cm right adnexal cyst with nodularity and blood flow. She was also noted to have a fibroid uterus, 12 fibroids were measured (ranged from 1.2-4.2 cm in size). CA 125 was 15. An MRI was done and the right ovarian lesion was reported as simple and cystic without enhancing solid components or areas of complexity. A repeat ultrasound was performed 6 weeks later and the cystic mass in the right adnexa was stable, still with nodularity and vascularity. Dr Denman George (GYN Oncologist) reviewed the images and agreed that the cystic mass should be removed, but didn't feel it needed to be done by Oncology.  The patient has a several year h/o intermittent pelvic and lower back pain. She always gets the pain prior to her cycle, but also gets it randomly throughout the month. She describes the pain as a dull ache, it varies in intensity. She is asking about hysterectomy. She has reported normal BM's daily. She does have herniated disc's and has some shooting pain down her legs.  The patient has had some abnormal perimenopausal bleeding in the last few months. Endometrial stripe was 3.96 cm and uniform on 11/17/19.  She c/o intermittent mixed incontinence, urge is worse than stress incontinence. Can void up to 15 x a day. She leaks a couple of times a day. She has urgency to void. She mostly leaks a small amount, can leak a large amount (only with urge incontinence).  She has ~20 oz of coffee day. The stress incontinence is minimal.  GYNECOLOGIC HISTORY:  Patient's last menstrual period was 11/09/2019 (exact date).  Contraception: Condoms  Menopausal hormone therapy: None          OB History      Gravida Para Term Preterm AB Living   2 2 2   2     SAB TAB Ectopic Multiple Live Births        2           Patient Active Problem List   Diagnosis Date Noted  . Chronic back pain   . Lumbar radiculopathy 09/12/2018       Past Medical History:  Diagnosis Date  . Chronic back pain   . Herniated lumbar intervertebral disc   . History of cyst of breast   . History of ovarian cyst   . History of recurrent UTIs   . Migraine with aura   . Nerve pain   . Raynaud disease   . Ruptured lumbar intervertebral disc    3   . Sciatica         Past Surgical History:  Procedure Laterality Date  . CHOLECYSTECTOMY     2009  . KNEE SURGERY     right knee, 2011  . TONSILLECTOMY           Current Outpatient Medications  Medication Sig Dispense Refill  . ciclopirox (PENLAC) 8 % solution Apply 1 application topically daily.    Marland Kitchen loratadine (CLARITIN) 10 MG tablet Take 1 tablet by mouth daily.    . nitrofurantoin (MACRODANTIN) 50 MG capsule 1 tablet po prn intercourse 30 capsule 2  . medroxyPROGESTERone (PROVERA) 5 MG tablet 1 tablet po q day  x 5 days every other month if no spontaneous menses (Patient not taking: Reported on 12/01/2019) 15 tablet 1   No current facility-administered medications for this visit.   ALLERGIES: Ciprofloxacin, Milk-related compounds, and Prednisone       Family History  Problem Relation Age of Onset  . Osteoporosis Mother   . Stroke Mother   . Migraines Mother   . Myelodysplastic syndrome Father   . Heart disease Father   . Bladder Cancer Paternal Uncle   . Stroke Maternal Grandmother   . Stroke Paternal Grandmother   . Lung cancer Maternal Grandfather    Social History        Socioeconomic History  . Marital status: Married    Spouse name: Not on file  . Number of children: Not on file  . Years of education: Not on file  . Highest education level: Not on file  Occupational History  . Not on file  Tobacco Use  . Smoking status: Never  Smoker  . Smokeless tobacco: Never Used  Substance and Sexual Activity  . Alcohol use: Yes    Alcohol/week: 2.0 standard drinks    Types: 2 Glasses of wine per week  . Drug use: Never  . Sexual activity: Yes    Birth control/protection: Condom  Other Topics Concern  . Not on file  Social History Narrative  . Not on file   Social Determinants of Health      Financial Resource Strain:   . Difficulty of Paying Living Expenses: Not on file  Food Insecurity:   . Worried About Charity fundraiser in the Last Year: Not on file  . Ran Out of Food in the Last Year: Not on file  Transportation Needs:   . Lack of Transportation (Medical): Not on file  . Lack of Transportation (Non-Medical): Not on file  Physical Activity:   . Days of Exercise per Week: Not on file  . Minutes of Exercise per Session: Not on file  Stress:   . Feeling of Stress : Not on file  Social Connections:   . Frequency of Communication with Friends and Family: Not on file  . Frequency of Social Gatherings with Friends and Family: Not on file  . Attends Religious Services: Not on file  . Active Member of Clubs or Organizations: Not on file  . Attends Archivist Meetings: Not on file  . Marital Status: Not on file  Intimate Partner Violence:   . Fear of Current or Ex-Partner: Not on file  . Emotionally Abused: Not on file  . Physically Abused: Not on file  . Sexually Abused: Not on file   Review of Systems  Constitutional: Negative.  HENT: Negative.  Eyes: Negative.  Respiratory: Negative.  Cardiovascular: Negative.  Gastrointestinal: Negative.  Genitourinary:  Pelvic pain  Musculoskeletal: Positive for back pain.  Skin: Negative.  Neurological: Negative.  Endo/Heme/Allergies: Negative.  Psychiatric/Behavioral: Negative.  She has pain that radiates down her legs from herniated disc's.  PHYSICAL EXAMINATION:  BP 118/80 (BP Location: Right Arm, Patient Position: Sitting, Cuff Size: Normal)   Pulse 72  Temp (!) 96.8 F (36 C) (Skin)  Wt 137 lb 3.2 oz (62.2 kg)  LMP 11/09/2019 (Exact Date)  General appearance: alert, cooperative and appears stated age  Neck: no adenopathy, supple, symmetrical, trachea midline and thyroid normal to inspection and palpation  Heart: regular rate and rhythm  Lungs: CTAB  Abdomen: soft, non-tender; bowel sounds normal; no masses, no organomegaly  Extremities: normal,  atraumatic, no cyanosis  Skin: normal color, texture and turgor, no rashes or lesions  Lymph: normal cervical supraclavicular and inguinal nodes  Neurologic: grossly normal  ASSESSMENT  Complex right adnexal mass  Fibroid uterus  Pelvic pain  Mixed incontinence, Urge>>stress  PLAN  We discussed the option of laparoscopy with removal of the right adnexal mass, bilateral salpingectomy vs TLH/removal of adnexal mass and bilateral salpingectomy  She understands that she may still continue to have pain after hysterectomy. She wants to proceed with hysterectomy  Discussed total laparoscopic hysterectomy, bilateral salpingectomy, removal of right adnexal mass (possible oophorectomy) and cystoscopy. Reviewed the risks of the procedure, including infection, bleeding, damage to bowel/badder/vessels/ureters. Discussed the possible need for laparotomy. Discussed post operative recovery and risk of cuff dehiscence. All of her questions were answered  Recommended she cut back on caffeine, discussed kegels and pelvic floor PT. We discussed the option of surgery for GSI, she would need urodynamics and it could worsen her urge incontinence. She declines surgery for the GSI  An After Visit Summary was printed and given to the patient.  Over 25 minutes face to face time of which over 50% was spent in counseling.

## 2019-12-01 NOTE — Telephone Encounter (Signed)
Patient has questions about surgery

## 2019-12-02 ENCOUNTER — Telehealth: Payer: Self-pay | Admitting: *Deleted

## 2019-12-02 NOTE — Telephone Encounter (Signed)
Patient has decided she would like to reschedule her surgery to 12/15/19.

## 2019-12-02 NOTE — Telephone Encounter (Signed)
Return call to patient. She is aware precert is completed. She agrees to move surgery to 12-15-19 due to bed availability. Surgery moved to 12-15-19 at 0730 at [facility] and appointments adjusted.

## 2019-12-02 NOTE — Telephone Encounter (Signed)
Call to patient regarding surgery date issues. There is currently no available extended recovery bed available and due to Covid 19, no overflow beds at hospital.  With this and precert issues with insurance, highly encourage moving surgery to 12-15-19. Patient to consider and call back.

## 2019-12-05 ENCOUNTER — Other Ambulatory Visit (HOSPITAL_COMMUNITY): Payer: Self-pay

## 2019-12-07 NOTE — Patient Instructions (Signed)
Get your covid test at [place]  On 12/11/19 at 2:40 PM   [place]       Your procedure is scheduled on 12/15/19   Report to [place]  at   5:30 A.M.   Call this number if you have problems the morning of [phone]   OUR ADDRESS IS [place], WE ARE LOCATED IN THE [facility] WITH [facility].     Do not eat food or drink liquids after midnight.    Take these medicines the morning of surgery with A SIP OF WATER: claritin   Do not wear jewelry, make-up or nail polish.  Do not wear lotions, powders, or perfumes, or deoderant.  Do not shave 48 hours prior to surgery. .  Do not bring valuables to the hospital.  [facility] is not responsible for any belongings or valuables.  Contacts, dentures or bridgework may not be worn into surgery.   For patients admitted to the hospital, discharge time will be determined by your treatment team.  Patients discharged the day of surgery will not be allowed to drive home.   Special instructions:   Please read over the following fact sheets that you were given:       [facility] - Preparing for Surgery  Before surgery, you can play an important role.   Because skin is not sterile, your skin needs to be as free of germs as possible.   You can reduce the number of germs on your skin by washing with CHG (chlorahexidine gluconate) soap before surgery.   CHG is an antiseptic cleaner which kills germs and bonds with the skin to continue killing germs even after washing. Please DO NOT use if you have an allergy to CHG or antibacterial soaps.   If your skin becomes reddened/irritated stop using the CHG and inform your nurse when you arrive at Short Stay.   You may shave your face/neck.  Please follow these instructions carefully:  1.  Shower with CHG Soap the night before surgery and the  morning of Surgery.  2.  If you choose to wash your hair, wash your hair first as usual with  your  normal  shampoo.  3.  After you shampoo, rinse your hair and body thoroughly to remove the  shampoo.                                        4.  Use CHG as you would any other liquid soap.  You can apply chg directly  to the skin and wash                       Gently with a scrungie or clean washcloth.  5.  Apply the CHG Soap to your body ONLY FROM THE NECK DOWN.   Do not use on face/ open                           Wound or open sores. Avoid contact with eyes, ears mouth and genitals (private parts).                       Wash face,  Genitals (private parts) with your normal soap.  6.  Wash thoroughly, paying special attention to the area where your surgery  will be performed.  7.  Thoroughly rinse your body with warm water from the neck down.  8.  DO NOT shower/wash with your normal soap after using and rinsing off  the CHG Soap.             9.  Pat yourself dry with a clean towel.            10.  Wear clean pajamas.            11.  Place clean sheets on your bed the night of your first shower and do not  sleep with pets. Day of Surgery : Do not apply any lotions/deodorants the morning of surgery.  Please wear clean clothes to the hospital/surgery center.  FAILURE TO FOLLOW THESE INSTRUCTIONS MAY RESULT IN THE CANCELLATION OF YOUR SURGERY PATIENT SIGNATURE_________________________________  NURSE SIGNATURE__________________________________  ________________________________________________________________________   Susan Leonard  An incentive spirometer is a tool that can help keep your lungs clear and active. This tool measures how well you are filling your lungs with each breath. Taking long deep breaths may help reverse or decrease the chance of developing breathing (pulmonary) problems (especially infection) following:  A long period of time when you are unable to move or be active. BEFORE THE PROCEDURE   If the spirometer includes an indicator to show your  best effort, your nurse or respiratory therapist will set it to a desired goal.  If possible, sit up straight or lean slightly forward. Try not to slouch.  Hold the incentive spirometer in an upright position. INSTRUCTIONS FOR USE  1. Sit on the edge of your bed if possible, or sit up as far as you can in bed or on a chair. 2. Hold the incentive spirometer in an upright position. 3. Breathe out normally. 4. Place the mouthpiece in your mouth and seal your lips tightly around it. 5. Breathe in slowly and as deeply as possible, raising the piston or the ball toward the top of the column. 6. Hold your breath for 3-5 seconds or for as long as possible. Allow the piston or ball to fall to the bottom of the column. 7. Remove the mouthpiece from your mouth and breathe out normally. 8. Rest for a few seconds and repeat Steps 1 through 7 at least 10 times every 1-2 hours when you are awake. Take your time and take a few normal breaths between deep breaths. 9. The spirometer may include an indicator to show your best effort. Use the indicator as a goal to work toward during each repetition. 10. After each set of 10 deep breaths, practice coughing to be sure your lungs are clear. If you have an incision (the cut made at the time of surgery), support your incision when coughing by placing a pillow or rolled up towels firmly against it. Once you are able to get out of bed, walk around indoors and cough well. You may stop using the incentive spirometer when instructed by your caregiver.  RISKS AND COMPLICATIONS  Take your time so you do not get dizzy or light-headed.  If you are in pain, you may need to take or ask for pain medication before doing incentive spirometry. It is harder to take a deep breath if you are having pain. AFTER USE  Rest and breathe slowly and easily.  It can be helpful to keep track of a log of your progress. Your caregiver  can provide you with a simple table to help with this. If  you are using the spirometer at home, follow these instructions: [place] IF:   You are having difficultly using the spirometer.  You have trouble using the spirometer as often as instructed.  Your pain medication is not giving enough relief while using the spirometer.  You develop fever of 100.5 F (38.1 C) or higher. SEEK IMMEDIATE MEDICAL CARE IF:   You cough up bloody sputum that had not been present before.  You develop fever of 102 F (38.9 C) or greater.  You develop worsening pain at or near the incision site. MAKE SURE YOU:   Understand these instructions.  Will watch your condition.  Will get help right away if you are not doing well or get worse. Document Released: 04/01/2007 Document Revised: 02/11/2012 Document Reviewed: 06/02/2007 [facility] Patient Information 2014 [place], [place].   ________________________________________________________________________

## 2019-12-09 ENCOUNTER — Encounter (HOSPITAL_COMMUNITY)
Admission: RE | Admit: 2019-12-09 | Discharge: 2019-12-09 | Disposition: A | Discharge status: Home / Self Care | Payer: 59 | Source: Ambulatory Visit | Attending: Obstetrics and Gynecology | Admitting: Obstetrics and Gynecology

## 2019-12-09 ENCOUNTER — Encounter (HOSPITAL_COMMUNITY): Payer: Self-pay

## 2019-12-09 ENCOUNTER — Other Ambulatory Visit: Payer: Self-pay

## 2019-12-09 DIAGNOSIS — Z01812 Encounter for preprocedural laboratory examination: Secondary | ICD-10-CM | POA: Diagnosis not present

## 2019-12-09 LAB — CBC
HCT: 41.1 % (ref 36.0–46.0)
Hemoglobin: 13.1 g/dL (ref 12.0–15.0)
MCH: 28.8 pg (ref 26.0–34.0)
MCHC: 31.9 g/dL (ref 30.0–36.0)
MCV: 90.3 fL (ref 80.0–100.0)
Platelets: 334 10*3/uL (ref 150–400)
RBC: 4.55 MIL/uL (ref 3.87–5.11)
RDW: 12.9 % (ref 11.5–15.5)
WBC: 9.1 10*3/uL (ref 4.0–10.5)
nRBC: 0 % (ref 0.0–0.2)

## 2019-12-09 LAB — COMPREHENSIVE METABOLIC PANEL
ALT: 16 U/L (ref 0–44)
AST: 20 U/L (ref 15–41)
Albumin: 4.3 g/dL (ref 3.5–5.0)
Alkaline Phosphatase: 37 U/L — ABNORMAL LOW (ref 38–126)
Anion gap: 7 (ref 5–15)
BUN: 14 mg/dL (ref 6–20)
CO2: 28 mmol/L (ref 22–32)
Calcium: 9.1 mg/dL (ref 8.9–10.3)
Chloride: 106 mmol/L (ref 98–111)
Creatinine, Ser: 0.93 mg/dL (ref 0.44–1.00)
GFR calc Af Amer: >60 mL/min (ref >60)
GFR calc non Af Amer: >60 mL/min (ref >60)
Glucose, Bld: 106 mg/dL — ABNORMAL HIGH (ref 70–99)
Potassium: 3.8 mmol/L (ref 3.5–5.1)
Sodium: 141 mmol/L (ref 135–145)
Total Bilirubin: 0.7 mg/dL (ref 0.3–1.2)
Total Protein: 6.7 g/dL (ref 6.5–8.1)

## 2019-12-09 LAB — ABO/RH: ABO/RH(D): A POS

## 2019-12-09 NOTE — Progress Notes (Signed)
PCP - Dr. Georga Kaufmann Cardiologist - none  Chest x-ray no-  EKG - none Stress Test - no ECHO - no Cardiac Cath - no  Sleep Study - no CPAP -   Fasting Blood Sugar - NA Checks Blood Sugar _____ times a day  Blood Thinner Instructions:NA Aspirin Instructions: Last Dose:  Anesthesia review:   Patient denies shortness of breath, fever, cough and chest pain at PAT appointment  yes Patient verbalized understanding of instructions that were given to them at the PAT appointment. Patient was also instructed that they will need to review over the PAT instructions again at home before surgery. yes

## 2019-12-11 ENCOUNTER — Other Ambulatory Visit (HOSPITAL_COMMUNITY)
Admission: RE | Admit: 2019-12-11 | Discharge: 2019-12-11 | Disposition: A | Discharge status: Home / Self Care | Payer: 59 | Source: Ambulatory Visit | Attending: Obstetrics and Gynecology | Admitting: Obstetrics and Gynecology

## 2019-12-11 DIAGNOSIS — Z01812 Encounter for preprocedural laboratory examination: Secondary | ICD-10-CM | POA: Insufficient documentation

## 2019-12-11 DIAGNOSIS — Z20822 Contact with and (suspected) exposure to covid-19: Secondary | ICD-10-CM | POA: Diagnosis not present

## 2019-12-12 LAB — NOVEL CORONAVIRUS, NAA (HOSP ORDER, SEND-OUT TO REF LAB; TAT 18-24 HRS): SARS-CoV-2, NAA: NOT DETECTED

## 2019-12-14 NOTE — Anesthesia Preprocedure Evaluation (Addendum)
Anesthesia Evaluation  Patient identified by MRN, date of birth, ID band Patient awake    Reviewed: Allergy & Precautions, NPO status , Patient's Chart, lab work & pertinent test results  History of Anesthesia Complications (+) PONV and history of anesthetic complications  Airway Mallampati: II  TM Distance: >3 FB Neck ROM: Full    Dental no notable dental hx.    Pulmonary neg pulmonary ROS,    Pulmonary exam normal breath sounds clear to auscultation       Cardiovascular negative cardio ROS Normal cardiovascular exam Rhythm:Regular Rate:Normal     Neuro/Psych  Headaches, negative psych ROS   GI/Hepatic negative GI ROS, Neg liver ROS,   Endo/Other  negative endocrine ROS  Renal/GU negative Renal ROS     Musculoskeletal Raynaud disease Ruptured lumbar intervertebral disc   Chronic back pain   Herniated lumbar intervertebral disc          Abdominal   Peds  Hematology negative hematology ROS (+)   Anesthesia Other Findings right complex adnexal cyst  Reproductive/Obstetrics                            Anesthesia Physical Anesthesia Plan  ASA: II  Anesthesia Plan: General   Post-op Pain Management:    Induction: Intravenous  PONV Risk Score and Plan: 4 or greater and Scopolamine patch - Pre-op, Midazolam, Dexamethasone, Ondansetron and Treatment may vary due to age or medical condition  Airway Management Planned: Oral ETT  Additional Equipment:   Intra-op Plan:   Post-operative Plan: Extubation in OR  Informed Consent: I have reviewed the patients History and Physical, chart, labs and discussed the procedure including the risks, benefits and alternatives for the proposed anesthesia with the patient or authorized representative who has indicated his/her understanding and acceptance.     Dental advisory given  Plan Discussed with: CRNA  Anesthesia Plan Comments:         Anesthesia Quick Evaluation

## 2019-12-15 ENCOUNTER — Ambulatory Visit (HOSPITAL_BASED_OUTPATIENT_CLINIC_OR_DEPARTMENT_OTHER): Payer: 59 | Admitting: Anesthesiology

## 2019-12-15 ENCOUNTER — Ambulatory Visit (HOSPITAL_BASED_OUTPATIENT_CLINIC_OR_DEPARTMENT_OTHER): Payer: 59 | Admitting: Physician Assistant

## 2019-12-15 ENCOUNTER — Other Ambulatory Visit: Payer: Self-pay | Admitting: Obstetrics and Gynecology

## 2019-12-15 ENCOUNTER — Ambulatory Visit: Payer: Self-pay | Admitting: Obstetrics and Gynecology

## 2019-12-15 ENCOUNTER — Other Ambulatory Visit: Payer: Self-pay

## 2019-12-15 ENCOUNTER — Encounter (HOSPITAL_BASED_OUTPATIENT_CLINIC_OR_DEPARTMENT_OTHER): Payer: Self-pay | Admitting: Obstetrics and Gynecology

## 2019-12-15 ENCOUNTER — Ambulatory Visit (HOSPITAL_BASED_OUTPATIENT_CLINIC_OR_DEPARTMENT_OTHER)
Admission: RE | Admit: 2019-12-15 | Discharge: 2019-12-15 | Disposition: A | Discharge status: Home / Self Care | Payer: 59 | Attending: Obstetrics and Gynecology | Admitting: Obstetrics and Gynecology

## 2019-12-15 ENCOUNTER — Encounter (HOSPITAL_BASED_OUTPATIENT_CLINIC_OR_DEPARTMENT_OTHER)
Admission: RE | Disposition: A | Discharge status: Home / Self Care | Payer: Self-pay | Source: Home / Self Care | Attending: Obstetrics and Gynecology

## 2019-12-15 DIAGNOSIS — Z801 Family history of malignant neoplasm of trachea, bronchus and lung: Secondary | ICD-10-CM | POA: Diagnosis not present

## 2019-12-15 DIAGNOSIS — Z823 Family history of stroke: Secondary | ICD-10-CM | POA: Diagnosis not present

## 2019-12-15 DIAGNOSIS — M5440 Lumbago with sciatica, unspecified side: Secondary | ICD-10-CM | POA: Diagnosis not present

## 2019-12-15 DIAGNOSIS — Z8262 Family history of osteoporosis: Secondary | ICD-10-CM | POA: Insufficient documentation

## 2019-12-15 DIAGNOSIS — N3946 Mixed incontinence: Secondary | ICD-10-CM | POA: Diagnosis not present

## 2019-12-15 DIAGNOSIS — Z8052 Family history of malignant neoplasm of bladder: Secondary | ICD-10-CM | POA: Diagnosis not present

## 2019-12-15 DIAGNOSIS — G43909 Migraine, unspecified, not intractable, without status migrainosus: Secondary | ICD-10-CM | POA: Insufficient documentation

## 2019-12-15 DIAGNOSIS — R102 Pelvic and perineal pain: Secondary | ICD-10-CM | POA: Insufficient documentation

## 2019-12-15 DIAGNOSIS — Z793 Long term (current) use of hormonal contraceptives: Secondary | ICD-10-CM | POA: Diagnosis not present

## 2019-12-15 DIAGNOSIS — Z90710 Acquired absence of both cervix and uterus: Secondary | ICD-10-CM | POA: Diagnosis present

## 2019-12-15 DIAGNOSIS — Z888 Allergy status to other drugs, medicaments and biological substances status: Secondary | ICD-10-CM | POA: Insufficient documentation

## 2019-12-15 DIAGNOSIS — Z9049 Acquired absence of other specified parts of digestive tract: Secondary | ICD-10-CM | POA: Diagnosis not present

## 2019-12-15 DIAGNOSIS — N838 Other noninflammatory disorders of ovary, fallopian tube and broad ligament: Secondary | ICD-10-CM | POA: Insufficient documentation

## 2019-12-15 DIAGNOSIS — D282 Benign neoplasm of uterine tubes and ligaments: Secondary | ICD-10-CM | POA: Insufficient documentation

## 2019-12-15 DIAGNOSIS — M5416 Radiculopathy, lumbar region: Secondary | ICD-10-CM | POA: Diagnosis not present

## 2019-12-15 DIAGNOSIS — D259 Leiomyoma of uterus, unspecified: Secondary | ICD-10-CM | POA: Diagnosis not present

## 2019-12-15 DIAGNOSIS — Z8249 Family history of ischemic heart disease and other diseases of the circulatory system: Secondary | ICD-10-CM | POA: Diagnosis not present

## 2019-12-15 DIAGNOSIS — N83291 Other ovarian cyst, right side: Secondary | ICD-10-CM

## 2019-12-15 DIAGNOSIS — Z91011 Allergy to milk products: Secondary | ICD-10-CM | POA: Diagnosis not present

## 2019-12-15 DIAGNOSIS — G8929 Other chronic pain: Secondary | ICD-10-CM | POA: Diagnosis not present

## 2019-12-15 DIAGNOSIS — Z881 Allergy status to other antibiotic agents status: Secondary | ICD-10-CM | POA: Diagnosis not present

## 2019-12-15 DIAGNOSIS — Z820 Family history of epilepsy and other diseases of the nervous system: Secondary | ICD-10-CM | POA: Insufficient documentation

## 2019-12-15 DIAGNOSIS — I7300 Raynaud's syndrome without gangrene: Secondary | ICD-10-CM | POA: Insufficient documentation

## 2019-12-15 HISTORY — PX: CYSTOSCOPY: SHX5120

## 2019-12-15 HISTORY — DX: Other specified postprocedural states: R11.2

## 2019-12-15 HISTORY — DX: Other specified postprocedural states: Z98.890

## 2019-12-15 HISTORY — DX: Adverse effect of unspecified anesthetic, initial encounter: T41.45XA

## 2019-12-15 HISTORY — PX: TOTAL LAPAROSCOPIC HYSTERECTOMY WITH SALPINGECTOMY: SHX6742

## 2019-12-15 LAB — TYPE AND SCREEN
ABO/RH(D): A POS
Antibody Screen: NEGATIVE

## 2019-12-15 LAB — CBC
HCT: 38.6 % (ref 36.0–46.0)
Hemoglobin: 11.9 g/dL — ABNORMAL LOW (ref 12.0–15.0)
MCH: 29 pg (ref 26.0–34.0)
MCHC: 30.8 g/dL (ref 30.0–36.0)
MCV: 94.1 fL (ref 80.0–100.0)
Platelets: 273 10*3/uL (ref 150–400)
RBC: 4.1 MIL/uL (ref 3.87–5.11)
RDW: 13 % (ref 11.5–15.5)
WBC: 12.4 10*3/uL — ABNORMAL HIGH (ref 4.0–10.5)
nRBC: 0 % (ref 0.0–0.2)

## 2019-12-15 LAB — POCT PREGNANCY, URINE: Preg Test, Ur: NEGATIVE

## 2019-12-15 SURGERY — HYSTERECTOMY, TOTAL, LAPAROSCOPIC, WITH SALPINGECTOMY
Anesthesia: General | Site: Abdomen

## 2019-12-15 MED ORDER — LIDOCAINE 2% (20 MG/ML) 5 ML SYRINGE
INTRAMUSCULAR | Status: AC
  Filled 2019-12-15: qty 5

## 2019-12-15 MED ORDER — OXYCODONE HCL 5 MG PO TABS
5.0000 mg | ORAL_TABLET | Freq: Once | ORAL | Status: DC | PRN
  Filled 2019-12-15: qty 1

## 2019-12-15 MED ORDER — KETOROLAC TROMETHAMINE 30 MG/ML IJ SOLN
INTRAMUSCULAR | Status: AC
  Filled 2019-12-15: qty 1

## 2019-12-15 MED ORDER — SCOPOLAMINE 1 MG/3DAYS TD PT72
1.0000 | MEDICATED_PATCH | TRANSDERMAL | Status: DC
  Administered 2019-12-15: 1.5 mg via TRANSDERMAL
  Filled 2019-12-15: qty 1

## 2019-12-15 MED ORDER — MIDAZOLAM HCL 2 MG/2ML IJ SOLN
INTRAMUSCULAR | Status: DC | PRN
  Administered 2019-12-15: 2 mg via INTRAVENOUS

## 2019-12-15 MED ORDER — ENOXAPARIN SODIUM 40 MG/0.4ML ~~LOC~~ SOLN
SUBCUTANEOUS | Status: AC
  Filled 2019-12-15: qty 0.4

## 2019-12-15 MED ORDER — OXYCODONE HCL 5 MG PO TABS: 5.0000 mg | ORAL_TABLET | ORAL | 0 refills | Status: DC | PRN

## 2019-12-15 MED ORDER — PROPOFOL 500 MG/50ML IV EMUL
INTRAVENOUS | Status: AC
  Filled 2019-12-15: qty 50

## 2019-12-15 MED ORDER — OXYCODONE HCL 5 MG/5ML PO SOLN
5.0000 mg | Freq: Once | ORAL | Status: DC | PRN
  Filled 2019-12-15: qty 5

## 2019-12-15 MED ORDER — ONDANSETRON HCL 4 MG/2ML IJ SOLN
INTRAMUSCULAR | Status: DC | PRN
  Administered 2019-12-15 (×2): 4 mg via INTRAVENOUS

## 2019-12-15 MED ORDER — ONDANSETRON HCL 4 MG/2ML IJ SOLN
4.0000 mg | Freq: Four times a day (QID) | INTRAMUSCULAR | Status: DC | PRN
  Administered 2019-12-15: 4 mg via INTRAVENOUS
  Filled 2019-12-15: qty 2

## 2019-12-15 MED ORDER — KCL IN DEXTROSE-NACL 20-5-0.45 MEQ/L-%-% IV SOLN
INTRAVENOUS | Status: DC
  Filled 2019-12-15: qty 1000

## 2019-12-15 MED ORDER — PROPOFOL 10 MG/ML IV BOLUS
INTRAVENOUS | Status: DC | PRN
  Administered 2019-12-15: 150 mg via INTRAVENOUS

## 2019-12-15 MED ORDER — KETOROLAC TROMETHAMINE 30 MG/ML IJ SOLN
30.0000 mg | Freq: Four times a day (QID) | INTRAMUSCULAR | Status: DC
  Administered 2019-12-15: 30 mg via INTRAVENOUS
  Filled 2019-12-15: qty 1

## 2019-12-15 MED ORDER — PROMETHAZINE HCL 25 MG/ML IJ SOLN
6.2500 mg | INTRAMUSCULAR | Status: DC | PRN
  Filled 2019-12-15: qty 1

## 2019-12-15 MED ORDER — SIMETHICONE 80 MG PO CHEW
CHEWABLE_TABLET | ORAL | Status: AC
  Filled 2019-12-15: qty 1

## 2019-12-15 MED ORDER — KETOROLAC TROMETHAMINE 30 MG/ML IJ SOLN
30.0000 mg | Freq: Once | INTRAMUSCULAR | Status: DC
  Filled 2019-12-15: qty 1

## 2019-12-15 MED ORDER — BUPIVACAINE HCL (PF) 0.25 % IJ SOLN
INTRAMUSCULAR | Status: DC | PRN
  Administered 2019-12-15: 6 mL

## 2019-12-15 MED ORDER — ACETAMINOPHEN 500 MG PO TABS
ORAL_TABLET | ORAL | Status: AC
  Filled 2019-12-15: qty 2

## 2019-12-15 MED ORDER — FAMOTIDINE 20 MG PO TABS
ORAL_TABLET | ORAL | Status: AC
  Filled 2019-12-15: qty 1

## 2019-12-15 MED ORDER — IBUPROFEN 800 MG PO TABS
800.0000 mg | ORAL_TABLET | Freq: Four times a day (QID) | ORAL | Status: DC
  Filled 2019-12-15: qty 1

## 2019-12-15 MED ORDER — MIDAZOLAM HCL 2 MG/2ML IJ SOLN
INTRAMUSCULAR | Status: AC
  Filled 2019-12-15: qty 2

## 2019-12-15 MED ORDER — IBUPROFEN 800 MG PO TABS: 800.0000 mg | ORAL_TABLET | Freq: Four times a day (QID) | ORAL | 0 refills | Status: AC

## 2019-12-15 MED ORDER — GABAPENTIN 300 MG PO CAPS
300.0000 mg | ORAL_CAPSULE | ORAL | Status: AC
  Administered 2019-12-15: 06:00:00 300 mg via ORAL
  Filled 2019-12-15: qty 1

## 2019-12-15 MED ORDER — SODIUM CHLORIDE (PF) 0.9 % IJ SOLN
INTRAMUSCULAR | Status: DC | PRN
  Administered 2019-12-15: 30 mL

## 2019-12-15 MED ORDER — KETOROLAC TROMETHAMINE 30 MG/ML IJ SOLN
INTRAMUSCULAR | Status: DC | PRN
  Administered 2019-12-15: 30 mg via INTRAVENOUS

## 2019-12-15 MED ORDER — ARTIFICIAL TEARS OPHTHALMIC OINT
TOPICAL_OINTMENT | OPHTHALMIC | Status: AC
  Filled 2019-12-15: qty 3.5

## 2019-12-15 MED ORDER — DOCUSATE SODIUM 100 MG PO CAPS
ORAL_CAPSULE | ORAL | Status: AC
  Filled 2019-12-15: qty 1

## 2019-12-15 MED ORDER — ALUM & MAG HYDROXIDE-SIMETH 200-200-20 MG/5ML PO SUSP
30.0000 mL | ORAL | Status: DC | PRN
  Filled 2019-12-15: qty 30

## 2019-12-15 MED ORDER — DEXAMETHASONE SODIUM PHOSPHATE 10 MG/ML IJ SOLN
INTRAMUSCULAR | Status: DC | PRN
  Administered 2019-12-15: 5 mg via INTRAVENOUS

## 2019-12-15 MED ORDER — OXYCODONE HCL 5 MG PO TABS
5.0000 mg | ORAL_TABLET | ORAL | Status: DC | PRN
  Filled 2019-12-15: qty 2

## 2019-12-15 MED ORDER — ACETAMINOPHEN 500 MG PO TABS
1000.0000 mg | ORAL_TABLET | Freq: Four times a day (QID) | ORAL | Status: DC
  Administered 2019-12-15 (×2): 1000 mg via ORAL
  Filled 2019-12-15: qty 2

## 2019-12-15 MED ORDER — HYDROMORPHONE HCL 1 MG/ML IJ SOLN
INTRAMUSCULAR | Status: AC
  Filled 2019-12-15: qty 1

## 2019-12-15 MED ORDER — ONDANSETRON HCL 4 MG PO TABS: 4.0000 mg | ORAL_TABLET | Freq: Three times a day (TID) | ORAL | 0 refills | Status: DC | PRN

## 2019-12-15 MED ORDER — PROPOFOL 10 MG/ML IV BOLUS
INTRAVENOUS | Status: AC
  Filled 2019-12-15: qty 40

## 2019-12-15 MED ORDER — ROCURONIUM BROMIDE 10 MG/ML (PF) SYRINGE
PREFILLED_SYRINGE | INTRAVENOUS | Status: DC | PRN
  Administered 2019-12-15: 20 mg via INTRAVENOUS
  Administered 2019-12-15 (×2): 10 mg via INTRAVENOUS
  Administered 2019-12-15: 50 mg via INTRAVENOUS

## 2019-12-15 MED ORDER — ENOXAPARIN SODIUM 40 MG/0.4ML ~~LOC~~ SOLN
40.0000 mg | SUBCUTANEOUS | Status: AC
  Administered 2019-12-15: 06:00:00 40 mg via SUBCUTANEOUS
  Filled 2019-12-15: qty 0.4

## 2019-12-15 MED ORDER — ENOXAPARIN SODIUM 40 MG/0.4ML ~~LOC~~ SOLN: 40.0000 mg | SUBCUTANEOUS | Status: DC

## 2019-12-15 MED ORDER — CEFAZOLIN SODIUM-DEXTROSE 2-4 GM/100ML-% IV SOLN
2.0000 g | INTRAVENOUS | Status: AC
  Administered 2019-12-15: 2 g via INTRAVENOUS
  Filled 2019-12-15: qty 100

## 2019-12-15 MED ORDER — HYDROMORPHONE HCL 1 MG/ML IJ SOLN
0.2500 mg | INTRAMUSCULAR | Status: DC | PRN
  Administered 2019-12-15: 0.25 mg via INTRAVENOUS
  Administered 2019-12-15: 0.5 mg via INTRAVENOUS
  Administered 2019-12-15: 0.25 mg via INTRAVENOUS
  Filled 2019-12-15: qty 0.5

## 2019-12-15 MED ORDER — MENTHOL 3 MG MT LOZG
1.0000 | LOZENGE | OROMUCOSAL | Status: DC | PRN
  Filled 2019-12-15: qty 9

## 2019-12-15 MED ORDER — ACETAMINOPHEN 500 MG PO TABS: 1000.0000 mg | ORAL_TABLET | Freq: Four times a day (QID) | ORAL | 0 refills | Status: AC

## 2019-12-15 MED ORDER — ONDANSETRON HCL 4 MG/2ML IJ SOLN
INTRAMUSCULAR | Status: AC
  Filled 2019-12-15: qty 4

## 2019-12-15 MED ORDER — PANTOPRAZOLE SODIUM 40 MG IV SOLR
40.0000 mg | Freq: Every day | INTRAVENOUS | Status: DC
  Filled 2019-12-15: qty 40

## 2019-12-15 MED ORDER — HYDROMORPHONE HCL 1 MG/ML IJ SOLN
0.2000 mg | INTRAMUSCULAR | Status: DC | PRN
  Filled 2019-12-15: qty 1

## 2019-12-15 MED ORDER — FENTANYL CITRATE (PF) 100 MCG/2ML IJ SOLN
INTRAMUSCULAR | Status: DC | PRN
  Administered 2019-12-15 (×3): 50 ug via INTRAVENOUS

## 2019-12-15 MED ORDER — ROPIVACAINE HCL 5 MG/ML IJ SOLN
INTRAMUSCULAR | Status: DC | PRN
  Administered 2019-12-15: 30 mL

## 2019-12-15 MED ORDER — PROPOFOL 500 MG/50ML IV EMUL
INTRAVENOUS | Status: DC | PRN
  Administered 2019-12-15: 25 ug/kg/min via INTRAVENOUS

## 2019-12-15 MED ORDER — DOCUSATE SODIUM 100 MG PO CAPS
100.0000 mg | ORAL_CAPSULE | Freq: Two times a day (BID) | ORAL | Status: DC
  Administered 2019-12-15: 100 mg via ORAL
  Filled 2019-12-15: qty 1

## 2019-12-15 MED ORDER — LACTATED RINGERS IV SOLN
INTRAVENOUS | Status: DC
  Filled 2019-12-15 (×2): qty 1000

## 2019-12-15 MED ORDER — GABAPENTIN 300 MG PO CAPS
ORAL_CAPSULE | ORAL | Status: AC
  Filled 2019-12-15: qty 1

## 2019-12-15 MED ORDER — LIDOCAINE 2% (20 MG/ML) 5 ML SYRINGE
INTRAMUSCULAR | Status: DC | PRN
  Administered 2019-12-15: 80 mg via INTRAVENOUS

## 2019-12-15 MED ORDER — KETOROLAC TROMETHAMINE 30 MG/ML IJ SOLN
30.0000 mg | Freq: Once | INTRAMUSCULAR | Status: DC | PRN
  Filled 2019-12-15: qty 1

## 2019-12-15 MED ORDER — SUGAMMADEX SODIUM 200 MG/2ML IV SOLN
INTRAVENOUS | Status: DC | PRN
  Administered 2019-12-15: 200 mg via INTRAVENOUS

## 2019-12-15 MED ORDER — HEMOSTATIC AGENTS (NO CHARGE) OPTIME
TOPICAL | Status: DC | PRN
  Administered 2019-12-15: 1 via TOPICAL

## 2019-12-15 MED ORDER — ONDANSETRON HCL 4 MG PO TABS
4.0000 mg | ORAL_TABLET | Freq: Four times a day (QID) | ORAL | Status: DC | PRN
  Filled 2019-12-15: qty 1

## 2019-12-15 MED ORDER — FAMOTIDINE 20 MG PO TABS
20.0000 mg | ORAL_TABLET | Freq: Once | ORAL | Status: AC
  Administered 2019-12-15: 20 mg via ORAL
  Filled 2019-12-15: qty 1

## 2019-12-15 MED ORDER — ONDANSETRON HCL 4 MG/2ML IJ SOLN
INTRAMUSCULAR | Status: AC
  Filled 2019-12-15: qty 2

## 2019-12-15 MED ORDER — DEXAMETHASONE SODIUM PHOSPHATE 10 MG/ML IJ SOLN
INTRAMUSCULAR | Status: AC
  Filled 2019-12-15: qty 1

## 2019-12-15 MED ORDER — ROCURONIUM BROMIDE 10 MG/ML (PF) SYRINGE
PREFILLED_SYRINGE | INTRAVENOUS | Status: AC
  Filled 2019-12-15: qty 10

## 2019-12-15 MED ORDER — ACETAMINOPHEN 500 MG PO TABS
1000.0000 mg | ORAL_TABLET | ORAL | Status: AC
  Administered 2019-12-15: 1000 mg via ORAL
  Filled 2019-12-15: qty 2

## 2019-12-15 MED ORDER — FENTANYL CITRATE (PF) 250 MCG/5ML IJ SOLN
INTRAMUSCULAR | Status: AC
  Filled 2019-12-15: qty 5

## 2019-12-15 MED ORDER — DOCUSATE SODIUM 100 MG PO CAPS: 100.0000 mg | ORAL_CAPSULE | Freq: Two times a day (BID) | ORAL | 0 refills | Status: DC

## 2019-12-15 MED ORDER — SCOPOLAMINE 1 MG/3DAYS TD PT72
MEDICATED_PATCH | TRANSDERMAL | Status: AC
  Filled 2019-12-15: qty 1

## 2019-12-15 MED ORDER — CEFAZOLIN SODIUM-DEXTROSE 2-4 GM/100ML-% IV SOLN
INTRAVENOUS | Status: AC
  Filled 2019-12-15: qty 100

## 2019-12-15 MED ORDER — ZOLPIDEM TARTRATE 5 MG PO TABS
5.0000 mg | ORAL_TABLET | Freq: Every evening | ORAL | Status: DC | PRN
  Filled 2019-12-15: qty 1

## 2019-12-15 SURGICAL SUPPLY — 64 items
APPLICATOR ARISTA FLEXITIP XL (MISCELLANEOUS) ×3 IMPLANT
BLADE SURG 10 STRL SS (BLADE) IMPLANT
CABLE HIGH FREQUENCY MONO STRZ (ELECTRODE) IMPLANT
CANISTER SUCT 3000ML PPV (MISCELLANEOUS) ×3 IMPLANT
CELL SAVER LIPIGURD (MISCELLANEOUS) IMPLANT
COVER MAYO STAND STRL (DRAPES) ×3 IMPLANT
COVER TABLE BACK 60X90 (DRAPES) IMPLANT
COVER WAND RF STERILE (DRAPES) ×3 IMPLANT
DECANTER SPIKE VIAL GLASS SM (MISCELLANEOUS) ×3 IMPLANT
DERMABOND ADVANCED (GAUZE/BANDAGES/DRESSINGS) ×1
DERMABOND ADVANCED .7 DNX12 (GAUZE/BANDAGES/DRESSINGS) ×2 IMPLANT
DRSG COVADERM PLUS 2X2 (GAUZE/BANDAGES/DRESSINGS) ×9 IMPLANT
DRSG OPSITE POSTOP 3X4 (GAUZE/BANDAGES/DRESSINGS) ×3 IMPLANT
DURAPREP 26ML APPLICATOR (WOUND CARE) ×3 IMPLANT
EXTRT SYSTEM ALEXIS 14CM (MISCELLANEOUS)
EXTRT SYSTEM ALEXIS 17CM (MISCELLANEOUS)
GAUZE 4X4 16PLY RFD (DISPOSABLE) ×3 IMPLANT
GLOVE BIO SURGEON STRL SZ 6.5 (GLOVE) ×3 IMPLANT
GLOVE BIOGEL PI IND STRL 7.0 (GLOVE) ×8 IMPLANT
GLOVE BIOGEL PI INDICATOR 7.0 (GLOVE) ×4
GOWN STRL REUS W/TWL LRG LVL3 (GOWN DISPOSABLE) ×12 IMPLANT
HARMONIC RUM II 2.5CM SILVER (DISPOSABLE)
HARMONIC RUM II 3.0CM SILVER (DISPOSABLE)
HARMONIC RUM II 3.5CM SILVER (DISPOSABLE) ×3
HARMONIC RUM II 4.0CM SILVER (DISPOSABLE)
HEMOSTAT ARISTA ABSORB 3G PWDR (HEMOSTASIS) ×3 IMPLANT
LIGASURE VESSEL 5MM BLUNT TIP (ELECTROSURGICAL) ×3 IMPLANT
NEEDLE INSUFFLATION 120MM (ENDOMECHANICALS) ×3 IMPLANT
PACK LAPAROSCOPY BASIN (CUSTOM PROCEDURE TRAY) ×3 IMPLANT
PACK TRENDGUARD 450 HYBRID PRO (MISCELLANEOUS) IMPLANT
POUCH LAPAROSCOPIC INSTRUMENT (MISCELLANEOUS) ×3 IMPLANT
PROTECTOR NERVE ULNAR (MISCELLANEOUS) ×6 IMPLANT
RETRACTOR WOUND ALXS 19CM XSML (INSTRUMENTS) IMPLANT
RTRCTR WOUND ALEXIS 19CM XSML (INSTRUMENTS)
SCALPEL HRMNC RUM II 2.5 SILVR (DISPOSABLE) IMPLANT
SCALPEL HRMNC RUM II 3.0 SILVR (DISPOSABLE) IMPLANT
SCALPEL HRMNC RUM II 3.5 SILVR (DISPOSABLE) ×2 IMPLANT
SCALPEL HRMNC RUM II 4.0 SILVR (DISPOSABLE) IMPLANT
SCISSORS LAP 5X35 DISP (ENDOMECHANICALS) IMPLANT
SET IRRIG TUBING LAPAROSCOPIC (IRRIGATION / IRRIGATOR) ×3 IMPLANT
SET IRRIG Y TYPE TUR BLADDER L (SET/KITS/TRAYS/PACK) ×3 IMPLANT
SET TRI-LUMEN FLTR TB AIRSEAL (TUBING) ×3 IMPLANT
SHEARS HARMONIC ACE PLUS 36CM (ENDOMECHANICALS) ×3 IMPLANT
SUT VIC AB 0 CT1 36 (SUTURE) ×3 IMPLANT
SUT VICRYL 0 UR6 27IN ABS (SUTURE) ×3 IMPLANT
SUT VICRYL 4-0 PS2 18IN ABS (SUTURE) ×3 IMPLANT
SUT VLOC 180 0 9IN  GS21 (SUTURE)
SUT VLOC 180 0 9IN GS21 (SUTURE) IMPLANT
SYR 50ML LL SCALE MARK (SYRINGE) ×6 IMPLANT
SYSTEM CONTND EXTRCTN KII BLLN (MISCELLANEOUS) IMPLANT
TIP RUMI ORANGE 6.7MMX12CM (TIP) IMPLANT
TIP UTERINE 5.1X6CM LAV DISP (MISCELLANEOUS) IMPLANT
TIP UTERINE 6.7X10CM GRN DISP (MISCELLANEOUS) IMPLANT
TIP UTERINE 6.7X6CM WHT DISP (MISCELLANEOUS) IMPLANT
TIP UTERINE 6.7X8CM BLUE DISP (MISCELLANEOUS) ×3 IMPLANT
TOWEL OR 17X26 10 PK STRL BLUE (TOWEL DISPOSABLE) ×6 IMPLANT
TRAP SPECIMEN MUCOUS 40CC (MISCELLANEOUS) ×3 IMPLANT
TRAY FOLEY W/BAG SLVR 14FR (SET/KITS/TRAYS/PACK) ×3 IMPLANT
TRENDGUARD 450 HYBRID PRO PACK (MISCELLANEOUS)
TROCAR ADV FIXATION 5X100MM (TROCAR) ×3 IMPLANT
TROCAR PORT AIRSEAL 5X120 (TROCAR) ×3 IMPLANT
TROCAR XCEL NON BLADE 8MM B8LT (ENDOMECHANICALS) ×3 IMPLANT
TROCAR XCEL NON-BLD 5MMX100MML (ENDOMECHANICALS) ×3 IMPLANT
WARMER LAPAROSCOPE (MISCELLANEOUS) ×3 IMPLANT

## 2019-12-15 NOTE — Interval H&P Note (Signed)
History and Physical Interval Note:  12/15/2019 7:12 AM  Susan Leonard  has presented today for surgery, with the diagnosis of right complex adnexal cyst.  The various methods of treatment have been discussed with the patient and family. After consideration of risks, benefits and other options for treatment, the patient has consented to  Procedure(s): TOTAL LAPAROSCOPIC HYSTERECTOMY WITH bilateral SALPINGECTOMY and REMOVAL OF RIGHT ADNEXAL MASS, POSSIBLE RIGHT OOPHERECTOMY (Bilateral) CYSTOSCOPY (N/A) as a surgical intervention.  The patient's history has been reviewed, patient examined, no change in status, stable for surgery.  I have reviewed the patient's chart and labs.  Questions were answered to the patient's satisfaction.     Salvadore Dom

## 2019-12-15 NOTE — Anesthesia Procedure Notes (Addendum)
Procedure Name: Intubation Date/Time: 12/15/2019 7:35 AM Performed by: Mechele Claude, CRNA Pre-anesthesia Checklist: Patient identified, Emergency Drugs available, Suction available and Patient being monitored Patient Re-evaluated:Patient Re-evaluated prior to induction Oxygen Delivery Method: Circle system utilized Preoxygenation: Pre-oxygenation with 100% oxygen Induction Type: IV induction Ventilation: Mask ventilation without difficulty Laryngoscope Size: Mac and 3 Grade View: Grade II Tube type: Oral Tube size: 7.0 mm Number of attempts: 1 Airway Equipment and Method: Stylet and Oral airway Placement Confirmation: ETT inserted through vocal cords under direct vision,  positive ETCO2 and breath sounds checked- equal and bilateral Secured at: 21 cm Tube secured with: Tape Dental Injury: Teeth and Oropharynx as per pre-operative assessment

## 2019-12-15 NOTE — Discharge Summary (Signed)
Physician Discharge Summary  Patient ID: Susan Leonard MRN: [id number] DOB/AGE: 02/02/1966 54 y.o.  Admit date: 12/15/2019 Discharge date: 12/15/2019  Admission Diagnoses: right adnexal mass, fibroid uterus, pelvic pain  Discharge Diagnoses:  Active Problems:   Status post laparoscopic hysterectomy   Discharged Condition: good  Hospital Course: uncomplicated  Consults: None  Significant Diagnostic Studies: labs:  Lab Results  Component Value Date   WBC 12.4 (H) 12/15/2019   HGB 11.9 (L) 12/15/2019   HCT 38.6 12/15/2019   MCV 94.1 12/15/2019   PLT 273 12/15/2019     Treatments: surgery: total laparoscopic hysterectomy, bilateral salpingectomies, left ovarian biopsy, cystoscopy.  Today's Vitals   12/15/19 1155 12/15/19 1157 12/15/19 1245 12/15/19 1540  BP: 90/65 98/65  102/72  Pulse: (!) 56   (!) 56  Resp:    15  Temp: (!) 97.5 F (36.4 C)   (!) 97.4 F (36.3 C)  TempSrc:      SpO2: 96%   96%  Weight:      Height:      PainSc: 0-No pain  1  0-No pain    Intake/Output Summary (Last 24 hours) at 12/15/2019 1705 Last data filed at 12/15/2019 1654 Gross per 24 hour  Intake 2460 ml  Output 400 ml  Net 2060 ml     Discharge Exam: Blood pressure 102/72, pulse (!) 56, temperature (!) 97.4 F (36.3 C), resp. rate 15, height 5\' 2"  (1.575 m), weight 60.8 kg, last menstrual period 12/08/2019, SpO2 96 %. General appearance: alert, cooperative and no distress Resp: clear to auscultation bilaterally Cardio: S1, S2 normal GI: soft, non tender, mildly distended, +BS. Incisions: some ecchymosis around left lower quadrant incision, no erythema, drainage or induration Extremities: extremities normal, atraumatic, no cyanosis or edema  Disposition: Discharge disposition: 01-Home or Self Care       Discharge Instructions    Call MD for:   Complete by: As directed    Heavy vaginal bleeding   Call MD for:  difficulty breathing, headache or visual disturbances    Complete by: As directed    Call MD for:  extreme fatigue   Complete by: As directed    Call MD for:  hives   Complete by: As directed    Call MD for:  persistant dizziness or light-headedness   Complete by: As directed    Call MD for:  persistant nausea and vomiting   Complete by: As directed    Call MD for:  redness, tenderness, or signs of infection (pain, swelling, redness, odor or green/yellow discharge around incision site)   Complete by: As directed    Call MD for:  severe uncontrolled pain   Complete by: As directed    Call MD for:  temperature >100.4   Complete by: As directed    Diet general   Complete by: As directed    Driving Restrictions   Complete by: As directed    No driving while taking narcotics and until you can slam on the brakes   Increase activity slowly   Complete by: As directed    May shower / Bathe   Complete by: As directed    May shower tomorrow morning after 10 am. No tub baths for 2 weeks.   No dressing needed   Complete by: As directed    Sexual Activity Restrictions   Complete by: As directed    No intercourse for 12 weeks        Signed: Salvadore Dom 12/15/2019,  5:03 PM

## 2019-12-15 NOTE — Anesthesia Postprocedure Evaluation (Signed)
Anesthesia Post Note  Patient: Susan Leonard  Procedure(s) Performed: TOTAL LAPAROSCOPIC HYSTERECTOMY WITH BILATERAL SALPINGECTOMY and REMOVAL OF RIGHT ADNEXAL MASS (Bilateral Abdomen) CYSTOSCOPY (N/A )     Patient location during evaluation: PACU Anesthesia Type: General Level of consciousness: awake and alert Pain management: pain level controlled Vital Signs Assessment: post-procedure vital signs reviewed and stable Respiratory status: spontaneous breathing, nonlabored ventilation, respiratory function stable and patient connected to nasal cannula oxygen Cardiovascular status: blood pressure returned to baseline and stable Postop Assessment: no apparent nausea or vomiting Anesthetic complications: no    Last Vitals:  Vitals:   12/15/19 1157 12/15/19 1540  BP: 98/65 102/72  Pulse:  (!) 56  Resp:  15  Temp:  (!) 36.3 C  SpO2:  96%    Last Pain:  Vitals:   12/15/19 1540  TempSrc:   PainSc: 0-No pain                 Aubriana Ravelo P Basilio Mead[place]

## 2019-12-15 NOTE — Transfer of Care (Signed)
    Last Vitals:  Vitals Value Taken Time  BP 111/66 12/15/19 0946  Temp    Pulse 63 12/15/19 0948  Resp 12 12/15/19 0948  SpO2 99 % 12/15/19 0948  Vitals shown include unvalidated device data.  Last Pain:  Vitals:   12/15/19 [id number]  TempSrc: Oral  PainSc: 0-No pain      Patients Stated Pain Goal: 3 (12/15/19 [id number])  Immediate Anesthesia Transfer of Care Note  Patient: Susan Leonard  Procedure(s) Performed: Procedure(s) (LRB): TOTAL LAPAROSCOPIC HYSTERECTOMY WITH BILATERAL SALPINGECTOMY and REMOVAL OF RIGHT ADNEXAL MASS (Bilateral) CYSTOSCOPY (N/A)  Patient Location: PACU  Anesthesia Type: General  Level of Consciousness:drowsy and follows commands  Airway & Oxygen Therapy: Patient Spontanous Breathing and Patient connected to nasal cannula oxygen  Post-op Assessment: Report given to PACU RN and Post -op Vital signs reviewed and stable  Post vital signs: Reviewed and stable  Complications: No apparent anesthesia complications

## 2019-12-15 NOTE — Op Note (Signed)
Preoperative Diagnosis: right adnexal mass, fibroid uterus, pelvic pain  Postoperative Diagnosis: right paratubal cyst, left ovarian mass, fibroid uterus, pelvic pain  Procedure:  Total Laparoscopic Hysterectomy with bilateral salpingectomies, left ovarian biopsy and cystoscopy  Surgeon: Dr Sumner Boast  Assistant: Dr Josefa Half, an MD assistant was necessary for tissue manipulation, retraction and positioning due to the complexity of the case and hospital policies  Anesthesia: General  EBL: 25 cc  Fluids: 1,400 cc LR  Urine output: [id number]  Complications: none  Specimen: uterus, cervix, bilateral tubes, left ovarian biopsy, pelvic washings  Indications for surgery: The patient is a 54 year old female, who presented with pelvic pain. Work up included an ultrasound that showed a 4.4 cm complex right adnexal mass and multiple small fibroids. A CA 125 was 15. A MRI reported the right ovarian lesion as simple and cystic without areas of complexity. A repeat ultrasound showed a persistent right adnexal mass with nodularity and vascularity.  The patient is aware of the risks and complications involved with the surgery and consent was obtained prior to the procedure.  Findings: fibroid uterus, large paratubal cyst on the right, small mass off of the left ovary, normal liver edge. Cystoscopy: normal bladder mucosa, normal ureteral jets bilaterally.  Intraoperative uterine weight was 252 grams.   Procedure: The patient was taken to the operating room with an IV in placed, preoperative antibiotics had been administered. She was placed in the dorsal lithotomy position. General anesthesia was administered. She was prepped and draped in the usual sterile fashion for an abdominal, vaginal surgery. A rumi uterine manipulator was placed, using a # 3.5 cup and a 8 cm extender. A foley catheter was placed.    The umbilicus was everted, injected with 0.25% marcaine and incised with a # 11 blade. 2 towel  clips were used to elevated the umbilicus and a veress needle was placed into the abdominal cavity. The abdominal cavity was insufflated with CO2, with normal intraabdominal pressures. After adequate pneumo-insufflation the veress needle was removed and the 5 mm laparoscope was placed into the abdominal cavity using the opti-view trocar. The patient was placed in trendelenburg and the abdominal pelvic cavity was inspected. 3 more trocars were placed: 1 in each lower quadrant approximately 3 cm medial to and superior to the anterior superior iliac spine and one in the midline approximately 6 cm above the pubic symphysis in the midline. These areas were injected with 0.25% marcaine, incised with a #11 blade and all trocars were inserted with direct visualization with the laparoscope. A # 5 airseal trocar was placed in the RLQ, a 5 mm trocar in the LLQ and a #8 trocar in the midline. The abdominal pelvic cavity was again inspected. Pelvic washing were obtained. A mixture of 30 cc of Robivacaine and 30 cc of NS was place in the pelvic cavity. The ureters were identified bilaterally.  The left tube was elevated from the pelvic sidewall, cauterized and cut with the ligasure device. The mesosalpinx was cauterized and cut with the ligasure device. The tube was separated from the uterus using the ligasure device and removed through the midline trocar. The left uterine ovarian ligament was cauterized and cut with the ligasure device. The left round ligament was cauterized and cut with the ligasure device and the anterior and posterior leafs of the broad ligament were taken down with the ligasure device. The harmonic scalpel was then used to take down the bladder flap and skeltonize the vessels. The left uterine  vessels were then clamped, cauterized and ligated with the ligasure device. Hemostasis was excellent. The same procedure was repeated on the right with the exception that the right tube was placed in the cul de sac  to removed vaginally after the uterus was removed. The small pedunculated mass on the left ovary was removed with the harmonic scalpel.   Using the rumi manipulator the uterus was pushed up in the pelvic cavity and the harmonic scalpel was used to separate the cervix from the vagina using the harmonic energy. The uterus and the left tube were  removed vaginally at this time. An occluder was placed in the vagina to maintain pneumoperitoneum. The vaginal cuff was then closed with a 0 V-lock suture. Hemostasis was excellent. The abdominal pelvic cavity was irrigated and suctioned dry. Pressure was released and hemostasis remained excellent.   The abdominal cavity was desufflated and the trocars were removed. The skin was closed with subcuticular stiches of 4-0 vicryl and dermabond was placed over the incisions.  The foley catheter was removed and cystoscopy was performed using a 70 degree scope. Both ureters expelled urine, no bladder abnormalities were noted. The bladder was allowed to drain and the cystoscope was removed.   The patient's abdomen and perineum were cleansed and she was taken out of the dorsal lithotomy position. Upon awakening she was extubated and taken to the recovery room in stable condition. The sponge and instrument counts were correct.

## 2019-12-17 LAB — CYTOLOGY - NON PAP

## 2019-12-17 LAB — SURGICAL PATHOLOGY

## 2019-12-22 ENCOUNTER — Encounter: Payer: Self-pay | Admitting: Obstetrics and Gynecology

## 2019-12-22 ENCOUNTER — Ambulatory Visit (INDEPENDENT_AMBULATORY_CARE_PROVIDER_SITE_OTHER): Payer: 59 | Admitting: Obstetrics and Gynecology

## 2019-12-22 ENCOUNTER — Other Ambulatory Visit: Payer: Self-pay

## 2019-12-22 VITALS — BP 124/70 | HR 85 | Temp 98.1°F | Ht 62.0 in | Wt 135.0 lb

## 2019-12-22 DIAGNOSIS — Z90710 Acquired absence of both cervix and uterus: Secondary | ICD-10-CM

## 2019-12-22 NOTE — Progress Notes (Signed)
GYNECOLOGY  VISIT   HPI: 54 y.o.   Married White or Caucasian Not Hispanic or Latino  female   [phone] with Patient's last menstrual period was 12/08/2019.   here for  One week post opt visit after TOTAL LAPAROSCOPIC HYSTERECTOMY WITH BILATERAL SALPINGECTOMY and REMOVAL OF RIGHT ADNEXAL MASS. Patient states that she is tired. She denies bleeding.  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  She is taking the tylenol and ibuprofen still, not taking the narcotics (none). Voiding fine, normal BM. She feels tired.   GYNECOLOGIC HISTORY: Patient's last menstrual period was 12/08/2019. Contraception:Hysterectomy  Menopausal hormone therapy: none        OB History    Gravida  2   Para  2   Term  2   Preterm      AB      Living  2     SAB      TAB      Ectopic      Multiple      Live Births  2              Patient Active Problem List   Diagnosis Date Noted  . Status post laparoscopic hysterectomy 12/15/2019  . Chronic back pain   . Lumbar radiculopathy 09/12/2018    Past Medical History:  Diagnosis Date  . Adverse effect of anesthetic    "I have panic attacks when I come out of anesthesia."   . Chronic back pain   . Herniated lumbar intervertebral disc   . History of cyst of breast   . History of ovarian cyst   . History of recurrent UTIs   . Migraine with aura   . Nerve pain   . PONV (postoperative nausea and vomiting)   . Raynaud disease   . Ruptured lumbar intervertebral disc    3   . Sciatica     Past Surgical History:  Procedure Laterality Date  . CHOLECYSTECTOMY     2009  . CYSTOSCOPY N/A 12/15/2019   Procedure: CYSTOSCOPY;  Surgeon: Salvadore Dom, MD;  Location: [facility];  Service: Gynecology;  Laterality: N/A;  . KNEE SURGERY     right knee, 2011  . TONSILLECTOMY    . TOTAL LAPAROSCOPIC HYSTERECTOMY  WITH SALPINGECTOMY Bilateral 12/15/2019   Procedure: TOTAL LAPAROSCOPIC HYSTERECTOMY WITH BILATERAL SALPINGECTOMY and REMOVAL OF RIGHT ADNEXAL MASS;  Surgeon: Salvadore Dom, MD;  Location: [facility];  Service: Gynecology;  Laterality: Bilateral;    Current Outpatient Medications  Medication Sig Dispense Refill  . acetaminophen (TYLENOL) 500 MG tablet Take 2 tablets (1,000 mg total) by mouth every 6 (six) hours. 30 tablet 0  . Biotin w/ Vitamins C & E (HAIR/SKIN/NAILS PO) Take 1 tablet by mouth daily.    . cholecalciferol (VITAMIN D3) 25 MCG (1000 UT) tablet Take 1,000 Units by mouth daily.    . ciclopirox (PENLAC) 8 % solution Apply 1 application topically daily.    Marland Kitchen docusate sodium (COLACE) 100 MG capsule Take 1 capsule (100 mg total) by mouth 2 (two) times daily. 30 capsule 0  . ibuprofen (ADVIL) 800 MG tablet Take 1 tablet (800 mg total) by mouth every 6 (six) hours. 30 tablet 0  . loratadine (CLARITIN) 10 MG tablet Take 10 mg by mouth daily.     . Multiple Vitamin (MULTIVITAMIN WITH MINERALS) TABS tablet Take 1 tablet by  mouth daily.    . nitrofurantoin (MACRODANTIN) 50 MG capsule 1 tablet po prn intercourse (Patient taking differently: Take 50 mg by mouth daily as needed (intercourse). ) 30 capsule 2  . ondansetron (ZOFRAN) 4 MG tablet Take 1 tablet (4 mg total) by mouth every 8 (eight) hours as needed for nausea or vomiting. 20 tablet 0  . oxyCODONE (OXY IR/ROXICODONE) 5 MG immediate release tablet Take 1-2 tablets (5-10 mg total) by mouth every 4 (four) hours as needed for moderate pain. 30 tablet 0   No current facility-administered medications for this visit.     ALLERGIES: Ciprofloxacin, Milk-related compounds, and Prednisone  Family History  Problem Relation Age of Onset  . Osteoporosis Mother   . Stroke Mother   . Migraines Mother   . Myelodysplastic syndrome Father   . Heart disease Father   . Bladder Cancer Paternal Uncle   . Stroke Maternal  Grandmother   . Stroke Paternal Grandmother   . Lung cancer Maternal Grandfather     Social History   Socioeconomic History  . Marital status: Married    Spouse name: Not on file  . Number of children: Not on file  . Years of education: Not on file  . Highest education level: Not on file  Occupational History  . Not on file  Tobacco Use  . Smoking status: Never Smoker  . Smokeless tobacco: Never Used  Substance and Sexual Activity  . Alcohol use: Yes    Alcohol/week: 2.0 standard drinks    Types: 2 Glasses of wine per week  . Drug use: Never  . Sexual activity: Yes    Birth control/protection: Condom  Other Topics Concern  . Not on file  Social History Narrative  . Not on file   Social Determinants of Health   Financial Resource Strain:   . Difficulty of Paying Living Expenses: Not on file  Food Insecurity:   . Worried About Charity fundraiser in the Last Year: Not on file  . Ran Out of Food in the Last Year: Not on file  Transportation Needs:   . Lack of Transportation (Medical): Not on file  . Lack of Transportation (Non-Medical): Not on file  Physical Activity:   . Days of Exercise per Week: Not on file  . Minutes of Exercise per Session: Not on file  Stress:   . Feeling of Stress : Not on file  Social Connections:   . Frequency of Communication with Friends and Family: Not on file  . Frequency of Social Gatherings with Friends and Family: Not on file  . Attends Religious Services: Not on file  . Active Member of Clubs or Organizations: Not on file  . Attends Archivist Meetings: Not on file  . Marital Status: Not on file  Intimate Partner Violence:   . Fear of Current or Ex-Partner: Not on file  . Emotionally Abused: Not on file  . Physically Abused: Not on file  . Sexually Abused: Not on file    Review of Systems  All other systems reviewed and are negative.   PHYSICAL EXAMINATION:    LMP 12/08/2019     General appearance: alert,  cooperative and appears stated age Abdomen: soft, non-tender; non distended, no masses,  no organomegaly Incisions: clean, dry, intact without erythema. There is slight induration and ecchymosis around the left lower quadrant incision, no signs of infection.    ASSESSMENT 1 week s/p TLH, BS, left ovarian biopsy. Benign pathology.  PLAN Routine post op f/u Call with any concerns.   An After Visit Summary was printed and given to the patient.

## 2019-12-25 ENCOUNTER — Telehealth: Payer: Self-pay | Admitting: Obstetrics and Gynecology

## 2019-12-25 MED ORDER — PROMETHAZINE HCL 12.5 MG PO TABS: ORAL_TABLET | ORAL | 0 refills | Status: DC

## 2019-12-25 NOTE — Telephone Encounter (Signed)
She has a h/o migraine headaches, does this feel similar to her typical headaches? Does she feel the nausea is from her headaches? Sometimes people can have rebound headache when taking pain medication. I would offer her phenergan 12.5 mg, 1-2 po q 4 hours prn nausea. It should help with her headaches as well. Warn her that it will make her sleepy. Make sure she checks her temp prior to taking ibuprofen or tylenol, want to make sure she isn't masking a fever.  It sounds like she isn't having abdominal concerns. I'm happy to see her if she would like.

## 2019-12-25 NOTE — Telephone Encounter (Signed)
I agree with the plan, if she can reduce her motrin and tylenol she should. The phenergan can also help with migraines, please make sure she knows this.

## 2019-12-25 NOTE — Telephone Encounter (Signed)
Spoke with patient. S/p TLH 12/15/19. Patient reports she has woken upt at 5 am for the past 3 days with a headache that starts at the base head and neck. Sweating at night and intermittent nausea. Vomited this morning after drinking coffee. Was able to eat cereal and drink some ginger ale after. Denies SOB, fever/chills, pelvic/abdominal pain or bleeding.   Has been alternating 800 mg of motrin q8 hrs and two extra strength tylenol q8hrs, is asking if this could be the cause of headache? Has not needed to take any narcotic pain medication.   Was seen in office on 1/19. Advised I will review with Dr. Talbert [name] and return call, patient agreeable.   Dr. Talbert [name] -please advise.

## 2019-12-25 NOTE — Telephone Encounter (Signed)
Patient is calling regarding post op headache and nausea. Patient stated that she has woken up the last two mornings at 5am with a headache and nausea. Patient stated that she has been taking Tylenol for post op pain, so she took more Tylenol when she woke up. This morning she thought the headache may be caused by the Tylenol and did not take any when she woke up. Patient stated that she vomited.

## 2019-12-25 NOTE — Telephone Encounter (Signed)
Spoke with patient. Advised per Dr. Talbert [name]. Patient states she was clear that phenergan can also help with migraines. Has taken a dose of Zofran since last call. Patient request RX to [facility] to have if the zofran does not provide relief. Patient thankful for call.   Rx for phenergan 12.5 mg tab #20/0RF to pharmacy.   Routing to provider for final review. Patient is agreeable to disposition. Will close encounter.

## 2019-12-25 NOTE — Telephone Encounter (Signed)
Spoke with patient. Patient reports headaches are similar to migraines, but has never experienced N/V with migraines. Patient has Rx Zofran, would like to try this first. Declines Rx for phenergan at this time, declines OV at this time. Patient will return call to provide update later today after trying zofran.   Patient asking if ok to continue motrin and Tylenol? Advised per Dr. Talbert [name], recommended taking temp prior to taking. Advised may try to reduce dosage if post-op pain is controled. Advised I will provide update to Dr. Talbert [name] and f/u if any additional recommendations. Patient agreeable.   Routing to Dr. Talbert [name].

## 2020-01-13 ENCOUNTER — Other Ambulatory Visit: Payer: Self-pay

## 2020-01-13 ENCOUNTER — Ambulatory Visit (INDEPENDENT_AMBULATORY_CARE_PROVIDER_SITE_OTHER): Payer: 59 | Admitting: Obstetrics and Gynecology

## 2020-01-13 ENCOUNTER — Encounter: Payer: Self-pay | Admitting: Obstetrics and Gynecology

## 2020-01-13 ENCOUNTER — Ambulatory Visit: Payer: Self-pay | Admitting: Obstetrics and Gynecology

## 2020-01-13 VITALS — BP 110/62 | HR 52 | Temp 98.6°F | Ht 62.0 in | Wt 135.0 lb

## 2020-01-13 DIAGNOSIS — Z90710 Acquired absence of both cervix and uterus: Secondary | ICD-10-CM

## 2020-01-13 DIAGNOSIS — N951 Menopausal and female climacteric states: Secondary | ICD-10-CM

## 2020-01-13 NOTE — Progress Notes (Signed)
GYNECOLOGY  VISIT   HPI: 54 y.o.   Married White or Caucasian Not Hispanic or Latino  female   [phone] with No LMP recorded. (Menstrual status: Irregular Periods).   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 She had some trouble with migraines post op. Doing well now. Increasing her walking. Prior to surgery she had a chronic lower back ache, resolved since. Urination is less frequent since surgery, much better. Normal BM's. Patient states that she has been experiencing hot flashes more so at night. During the day she is having 3-4 hot flashes, also having 3-4 hot flashes or night sweats at night. She has noticed that wine can trigger hot flashes and night sweats. Not sleeping great every night, some nights are okay. No trouble going back to sleep. She still noticed a pink ting when she wipes or on a panty liner.   GYNECOLOGIC HISTORY: No LMP recorded. (Menstrual status: Irregular Periods). Contraception:post Hysterectomy  Menopausal hormone therapy: none         OB History    Gravida  2   Para  2   Term  2   Preterm      AB      Living  2     SAB      TAB      Ectopic      Multiple      Live Births  2              Patient Active Problem List   Diagnosis Date Noted  . Status post laparoscopic hysterectomy 12/15/2019  . Chronic back pain   . Lumbar radiculopathy 09/12/2018    Past Medical History:  Diagnosis Date  . Adverse effect of anesthetic    "I have panic attacks when I come out of anesthesia."   . Chronic back pain   . Herniated lumbar intervertebral disc   . History of cyst of breast   . History of ovarian cyst   . History of recurrent UTIs   . Migraine with aura   . Nerve pain   . PONV (postoperative nausea and vomiting)   . Raynaud disease   .  Ruptured lumbar intervertebral disc    3   . Sciatica     Past Surgical History:  Procedure Laterality Date  . CHOLECYSTECTOMY     2009  . CYSTOSCOPY N/A 12/15/2019   Procedure: CYSTOSCOPY;  Surgeon: Salvadore Dom, MD;  Location: [facility];  Service: Gynecology;  Laterality: N/A;  . KNEE SURGERY     right knee, 2011  . TONSILLECTOMY    . TOTAL LAPAROSCOPIC HYSTERECTOMY WITH SALPINGECTOMY Bilateral 12/15/2019   Procedure: TOTAL LAPAROSCOPIC HYSTERECTOMY WITH BILATERAL SALPINGECTOMY and REMOVAL OF RIGHT ADNEXAL MASS;  Surgeon: Salvadore Dom, MD;  Location: [facility];  Service: Gynecology;  Laterality: Bilateral;    Current Outpatient Medications  Medication Sig Dispense Refill  . acetaminophen (TYLENOL) 500 MG tablet Take 2 tablets (1,000 mg total) by mouth every 6 (six) hours. 30 tablet 0  . Biotin w/ Vitamins C & E (HAIR/SKIN/NAILS PO) Take 1 tablet by mouth daily.    . cholecalciferol (VITAMIN D3) 25 MCG (1000 UT) tablet Take 1,000 Units by mouth daily.    . ciclopirox (PENLAC) 8 % solution Apply 1 application topically daily.    Marland Kitchen  docusate sodium (COLACE) 100 MG capsule Take 1 capsule (100 mg total) by mouth 2 (two) times daily. 30 capsule 0  . ibuprofen (ADVIL) 800 MG tablet Take 1 tablet (800 mg total) by mouth every 6 (six) hours. 30 tablet 0  . loratadine (CLARITIN) 10 MG tablet Take 10 mg by mouth daily.     . Multiple Vitamin (MULTIVITAMIN WITH MINERALS) TABS tablet Take 1 tablet by mouth daily.    . nitrofurantoin (MACRODANTIN) 50 MG capsule 1 tablet po prn intercourse 30 capsule 2  . promethazine (PHENERGAN) 12.5 MG tablet Take 1-2 tabs PO q4 hours PRN nausea. 20 tablet 0   No current facility-administered medications for this visit.     ALLERGIES: Ciprofloxacin, Milk-related compounds, and Prednisone  Family History  Problem Relation Age of Onset  . Osteoporosis Mother   . Stroke Mother   . Migraines Mother   .  Myelodysplastic syndrome Father   . Heart disease Father   . Bladder Cancer Paternal Uncle   . Stroke Maternal Grandmother   . Stroke Paternal Grandmother   . Lung cancer Maternal Grandfather     Social History   Socioeconomic History  . Marital status: Married    Spouse name: Not on file  . Number of children: Not on file  . Years of education: Not on file  . Highest education level: Not on file  Occupational History  . Not on file  Tobacco Use  . Smoking status: Never Smoker  . Smokeless tobacco: Never Used  Substance and Sexual Activity  . Alcohol use: Yes    Alcohol/week: 2.0 standard drinks    Types: 2 Glasses of wine per week  . Drug use: Never  . Sexual activity: Yes    Birth control/protection: Condom  Other Topics Concern  . Not on file  Social History Narrative  . Not on file   Social Determinants of Health   Financial Resource Strain:   . Difficulty of Paying Living Expenses: Not on file  Food Insecurity:   . Worried About Charity fundraiser in the Last Year: Not on file  . Ran Out of Food in the Last Year: Not on file  Transportation Needs:   . Lack of Transportation (Medical): Not on file  . Lack of Transportation (Non-Medical): Not on file  Physical Activity:   . Days of Exercise per Week: Not on file  . Minutes of Exercise per Session: Not on file  Stress:   . Feeling of Stress : Not on file  Social Connections:   . Frequency of Communication with Friends and Family: Not on file  . Frequency of Social Gatherings with Friends and Family: Not on file  . Attends Religious Services: Not on file  . Active Member of Clubs or Organizations: Not on file  . Attends Archivist Meetings: Not on file  . Marital Status: Not on file  Intimate Partner Violence:   . Fear of Current or Ex-Partner: Not on file  . Emotionally Abused: Not on file  . Physically Abused: Not on file  . Sexually Abused: Not on file    Review of Systems  All other  systems reviewed and are negative.   PHYSICAL EXAMINATION:    BP 110/62   Pulse (!) 52   Temp 98.6 F (37 C)   Ht 5\' 2"  (1.575 m)   Wt 135 lb (61.2 kg)   SpO2 99%   BMI 24.69 kg/m     General appearance: alert,  cooperative and appears stated age Neck: no adenopathy, supple, symmetrical, trachea Abdomen: soft, non-tender; non distended, no masses,  no organomegaly Incisions: healing well  Pelvic: External genitalia:  no lesions              Urethra:  normal appearing urethra with no masses, tenderness or lesions              Bartholins and Skenes: normal                 Vagina cuff: healing well, minimal area of possible friability at the cuff treated with silver nitrate.               Cervix: absent              Bimanual Exam:  Uterus:  uterus absent              Adnexa: no mass, fullness, tenderness              Chaperone was present for exam.  ASSESSMENT 4 weeks post op s/p TLH/BS Vasomotor symptoms    PLAN Discussed avoiding triggers, behavioral changes for vasomotor symptoms Discussed herbal products, gabapentin, ERT She will try herbal products and let me know if she wants to do anything else. No intercourse until 12 weeks post op Routine f/u.   An After Visit Summary was printed and given to the patient.

## 2020-01-13 NOTE — Patient Instructions (Addendum)
Try Estroven pm for night sweats.    Menopause Menopause is the normal time of life when menstrual periods stop completely. It is usually confirmed by 12 months without a menstrual period. The transition to menopause (perimenopause) most often happens between the ages of 54 and 60. During perimenopause, hormone levels change in your body, which can cause symptoms and affect your health. Menopause may increase your risk for:  Loss of bone (osteoporosis), which causes bone breaks (fractures).  Depression.  Hardening and narrowing of the arteries (atherosclerosis), which can cause heart attacks and strokes. What are the causes? This condition is usually caused by a natural change in hormone levels that happens as you get older. The condition may also be caused by surgery to remove both ovaries (bilateral oophorectomy). What increases the risk? This condition is more likely to start at an earlier age if you have certain medical conditions or treatments, including:  A tumor of the pituitary gland in the brain.  A disease that affects the ovaries and hormone production.  Radiation treatment for cancer.  Certain cancer treatments, such as chemotherapy or hormone (anti-estrogen) therapy.  Heavy smoking and excessive alcohol use.  Family history of early menopause. This condition is also more likely to develop earlier in women who are very thin. What are the signs or symptoms? Symptoms of this condition include:  Hot flashes.  Irregular menstrual periods.  Night sweats.  Changes in feelings about sex. This could be a decrease in sex drive or an increased comfort around your sexuality.  Vaginal dryness and thinning of the vaginal walls. This may cause painful intercourse.  Dryness of the skin and development of wrinkles.  Headaches.  Problems sleeping (insomnia).  Mood swings or irritability.  Memory problems.  Weight gain.  Hair growth on the face and chest.  Bladder  infections or problems with urinating. How is this diagnosed? This condition is diagnosed based on your medical history, a physical exam, your age, your menstrual history, and your symptoms. Hormone tests may also be done. How is this treated? In some cases, no treatment is needed. You and your health care provider should make a decision together about whether treatment is necessary. Treatment will be based on your individual condition and preferences. Treatment for this condition focuses on managing symptoms. Treatment may include:  Menopausal hormone therapy (MHT).  Medicines to treat specific symptoms or complications.  Acupuncture.  Vitamin or herbal supplements. Before starting treatment, make sure to let your health care provider know if you have a personal or family history of:  Heart disease.  Breast cancer.  Blood clots.  Diabetes.  Osteoporosis. Follow these instructions at home: Lifestyle  Do not use any products that contain nicotine or tobacco, such as cigarettes and e-cigarettes. If you need help quitting, ask your health care provider.  Get at least 30 minutes of physical activity on 5 or more days each week.  Avoid alcoholic and caffeinated beverages, as well as spicy foods. This may help prevent hot flashes.  Get 7-8 hours of sleep each night.  If you have hot flashes, try: ? Dressing in layers. ? Avoiding things that may trigger hot flashes, such as spicy food, warm places, or stress. ? Taking slow, deep breaths when a hot flash starts. ? Keeping a fan in your home and office.  Find ways to manage stress, such as deep breathing, meditation, or journaling.  Consider going to group therapy with other women who are having menopause symptoms. Ask your health  care provider about recommended group therapy meetings. Eating and drinking  Eat a healthy, balanced diet that contains whole grains, lean protein, low-fat dairy, and plenty of fruits and vegetables.   Your health care provider may recommend adding more soy to your diet. Foods that contain soy include tofu, tempeh, and soy milk.  Eat plenty of foods that contain calcium and vitamin D for bone health. Items that are rich in calcium include low-fat milk, yogurt, beans, almonds, sardines, broccoli, and kale. Medicines  Take over-the-counter and prescription medicines only as told by your health care provider.  Talk with your health care provider before starting any herbal supplements. If prescribed, take vitamins and supplements as told by your health care provider. These may include: ? Calcium. Women age 54 and older should get 1,200 mg (milligrams) of calcium every day. ? Vitamin D. Women need 600-800 International Units of vitamin D each day. ? Vitamins B12 and B6. Aim for 50 micrograms of B12 and 1.5 mg of B6 each day. General instructions  Keep track of your menstrual periods, including: ? When they occur. ? How heavy they are and how long they last. ? How much time passes between periods.  Keep track of your symptoms, noting when they start, how often you have them, and how long they last.  Use vaginal lubricants or moisturizers to help with vaginal dryness and improve comfort during sex.  Keep all follow-up visits as told by your health care provider. This is important. This includes any group therapy or counseling. Contact a health care provider if:  You are still having menstrual periods after age 54.  You have pain during sex.  You have not had a period for 12 months and you develop vaginal bleeding. Get help right away if:  You have: ? Severe depression. ? Excessive vaginal bleeding. ? Pain when you urinate. ? A fast or irregular heart beat (palpitations). ? Severe headaches. ? Abdomen (abdominal) pain or severe indigestion.  You fell and you think you have a broken bone.  You develop leg or chest pain.  You develop vision problems.  You feel a lump in your  breast. Summary  Menopause is the normal time of life when menstrual periods stop completely. It is usually confirmed by 12 months without a menstrual period.  The transition to menopause (perimenopause) most often happens between the ages of 54 and 69.  Symptoms can be managed through medicines, lifestyle changes, and complementary therapies such as acupuncture.  Eat a balanced diet that is rich in nutrients to promote bone health and heart health and to manage symptoms during menopause. This information is not intended to replace advice given to you by your health care provider. Make sure you discuss any questions you have with your health care provider. Document Revised: 11/01/2017 Document Reviewed: 12/22/2016 Elsevier Patient Education  2020 [employer].   Menopause and Hormone Replacement Therapy Menopause is a normal time of life when menstrual periods stop completely and the ovaries stop producing the female hormones estrogen and progesterone. This lack of hormones can affect your health and cause undesirable symptoms. Hormone replacement therapy (HRT) can relieve some of those symptoms. What is hormone replacement therapy? HRT is the use of artificial (synthetic) hormones to replace hormones that your body has stopped producing because you have reached menopause. What are my options for HRT?  HRT may consist of the synthetic hormones estrogen and progestin, or it may consist of only estrogen (estrogen-only therapy). You and your health  care provider will decide which form of HRT is best for you. If you choose to be on HRT and you have a uterus, estrogen and progestin are usually prescribed. Estrogen-only therapy is used for women who do not have a uterus. Possible options for taking HRT include:  Pills.  Patches.  Gels.  Sprays.  Vaginal cream.  Vaginal rings.  Vaginal inserts. The amount of hormone(s) that you take and how long you take the hormone(s) varies  according to your health. It is important to:  Begin HRT with the lowest possible dosage.  Stop HRT as soon as your health care provider tells you to stop.  Work with your health care provider so that you feel informed and comfortable with your decisions. What are the benefits of HRT? HRT can reduce the frequency and severity of menopausal symptoms. Benefits of HRT vary according to the kind of symptoms that you have, how severe they are, and your overall health. HRT may help to improve the following symptoms of menopause:  Hot flashes and night sweats. These are sudden feelings of heat that spread over the face and body. The skin may turn red, like a blush. Night sweats are hot flashes that happen while you are sleeping or trying to sleep.  Bone loss (osteoporosis). The body loses calcium more quickly after menopause, causing the bones to become weaker. This can increase the risk for bone breaks (fractures).  Vaginal dryness. The lining of the vagina can become thin and dry, which can cause pain during sex or cause infection, burning, or itching.  Urinary tract infections.  Urinary incontinence. This is the inability to control when you pass urine.  Irritability.  Short-term memory problems. What are the risks of HRT? Risks of HRT vary depending on your individual health and medical history. Risks of HRT also depend on whether you receive both estrogen and progestin or you receive estrogen only. HRT may increase the risk of:  Spotting. This is when a small amount of blood leaks from the vagina unexpectedly.  Endometrial cancer. This cancer is in the lining of the uterus (endometrium).  Breast cancer.  Increased density of breast tissue. This can make it harder to find breast cancer on a breast X-ray (mammogram).  Stroke.  Heart disease.  Blood clots.  Gallbladder disease.  Liver disease. Risks of HRT can increase if you have any of the following conditions:  Endometrial  cancer.  Liver disease.  Heart disease.  Breast cancer.  History of blood clots.  History of stroke. Follow these instructions at home:  Take over-the-counter and prescription medicines only as told by your health care provider.  Get mammograms, pelvic exams, and medical checkups as often as told by your health care provider.  Have Pap tests done as often as told by your health care provider. A Pap test is sometimes called a Pap smear. It is a screening test that is used to check for signs of cancer of the cervix and vagina. A Pap test can also identify the presence of infection or precancerous changes. Pap tests may be done: ? Every 3 years, starting at age 70. ? Every 5 years, starting after age 73, in combination with testing for human papillomavirus (HPV). ? More often or less often depending on other medical conditions you have, your age, and other risk factors.  It is up to you to get the results of your Pap test. Ask your health care provider, or the department that is doing the test,  when your results will be ready.  Keep all follow-up visits as told by your health care provider. This is important. Contact a health care provider if you have:  Pain or swelling in your legs.  Shortness of breath.  Chest pain.  Lumps or changes in your breasts or armpits.  Slurred speech.  Pain, burning, or bleeding when you urinate.  Unusual vaginal bleeding.  Dizziness or headaches.  Weakness or numbness in any part of your arms or legs.  Pain in your abdomen. Summary  Menopause is a normal time of life when menstrual periods stop completely and the ovaries stop producing the female hormones estrogen and progesterone.  Hormone replacement therapy (HRT) can relieve some of the symptoms of menopause.  HRT can reduce the frequency and severity of menopausal symptoms.  Risks of HRT vary depending on your individual health and medical history. This information is not intended  to replace advice given to you by your health care provider. Make sure you discuss any questions you have with your health care provider. Document Revised: 07/22/2018 Document Reviewed: 07/22/2018 Elsevier Patient Education  2020 [employer].

## 2020-03-01 ENCOUNTER — Encounter: Payer: Self-pay | Admitting: Obstetrics and Gynecology

## 2020-03-01 ENCOUNTER — Telehealth: Payer: Self-pay | Admitting: Obstetrics and Gynecology

## 2020-03-01 NOTE — Telephone Encounter (Signed)
Routing MyChart message to Dr. Jertson to review and advise.  

## 2020-03-01 NOTE — Telephone Encounter (Signed)
[facility]  Phone Number: [phone]  Hello Dr Talbert [name]- I hope you are well. I'm wondering how long post surgery I need to wait before I can donate blood? You performed a hysterectomy on Jan 12th. I also recently had Covid (positive test 3/9) from which I am mostly recovered except for loss of taste and smell for three weeks now. I had 5 days of fever, aches and chills at the beginning.  Thank you,  Susan Leonard

## 2020-04-14 ENCOUNTER — Ambulatory Visit (INDEPENDENT_AMBULATORY_CARE_PROVIDER_SITE_OTHER): Payer: 59

## 2020-04-14 ENCOUNTER — Other Ambulatory Visit: Payer: Self-pay | Admitting: Podiatry

## 2020-04-14 ENCOUNTER — Ambulatory Visit: Payer: 59 | Admitting: Podiatry

## 2020-04-14 ENCOUNTER — Other Ambulatory Visit: Payer: Self-pay

## 2020-04-14 DIAGNOSIS — M7661 Achilles tendinitis, right leg: Secondary | ICD-10-CM

## 2020-04-14 DIAGNOSIS — M7662 Achilles tendinitis, left leg: Secondary | ICD-10-CM

## 2020-04-14 DIAGNOSIS — M79671 Pain in right foot: Secondary | ICD-10-CM

## 2020-04-14 DIAGNOSIS — M79672 Pain in left foot: Secondary | ICD-10-CM | POA: Diagnosis not present

## 2020-04-14 MED ORDER — NITROGLYCERIN 0.1 MG/HR TD PT24: 0.2000 mg | MEDICATED_PATCH | Freq: Every day | TRANSDERMAL | Status: AC

## 2020-04-14 NOTE — Progress Notes (Signed)
Subjective:   Patient ID: Susan Leonard, female   DOB: 55 y.o.   MRN: [id number]   HPI Patient presents stating that she has had chronic pain in her Achilles tendons of both feet and its been going on for around a year and she does not remember specific injury but also feels like other joints get sore and she has been going through menopause.  Patient does not smoke likes to be active   Review of Systems  All other systems reviewed and are negative.       Objective:  Physical Exam Vitals and nursing note reviewed.  Constitutional:      Appearance: She is well-developed.  Pulmonary:     Effort: Pulmonary effort is normal.  Musculoskeletal:        General: Normal range of motion.  Skin:    General: Skin is warm.  Neurological:     Mental Status: She is alert.     Neurovascular status found to be intact muscle strength found to be adequate range of motion within normal limits.  Patient is noted to have pain at the watershed muscle tendon junction of the Achilles bilateral but no nodular formation and pain is only moderate with no equinus condition also noted.  Mild discomfort left on the medial side around posterior tib and into the medial ankle gutter.  Patient has good digital perfusion well oriented x3    Assessment:  Achilles tendinitis bilateral at the muscle tendon junction without nodular formation and tendinitis left     Plan:  H&P reviewed both conditions and for the back this is a difficult problem and I recommended physical therapy to try to stretch it and take pressure off of it along with nitroglycerin patches which were prescribed today.  I did do a careful injection posterior tib left 3 mg Dexasone Kenalog 5 mg Xylocaine and advised on supportive therapy and patient will be seen back for [place] to recheck  X-rays were negative for signs of spur formation or any kinds of flattening of the arch noted currently

## 2020-04-14 NOTE — Patient Instructions (Signed)

## 2020-05-10 ENCOUNTER — Telehealth: Payer: Self-pay | Admitting: Obstetrics and Gynecology

## 2020-05-10 NOTE — Telephone Encounter (Signed)
Spoke with patient. Patient asking if AEX is needed after hysterectomy? If so, when should she return?   Per review of Epic, no Hx of abnormal pap, confirmed with patient. Patient reports her last pap was w/ her PCP in July or August 2020.   Advised yearly AEX still recommended after hysterectomy. AEX covers overall GYN health, such as pelvic exam, breast exam, bone health, hormones. Patient request to schedule AEX w/ Dr. Talbert [name].   AEX scheduled for 08/15/20 at 3:30pm with Dr. Talbert [name]. Advised patient Dr. Talbert [name] will review, our office will return call if any additional recommendations. Patient agreeable.   Routing to provider for final review. Patient is agreeable to disposition. Will close encounter.

## 2020-05-10 NOTE — Telephone Encounter (Signed)
Patient had a hysterectomy in January and wonders when she will need an aex or follow up?

## 2020-06-09 ENCOUNTER — Ambulatory Visit: Payer: 59 | Admitting: Podiatry

## 2020-07-14 ENCOUNTER — Other Ambulatory Visit: Payer: Self-pay | Admitting: Internal Medicine

## 2020-07-14 DIAGNOSIS — Z1231 Encounter for screening mammogram for malignant neoplasm of breast: Secondary | ICD-10-CM

## 2020-07-27 DIAGNOSIS — Z780 Asymptomatic menopausal state: Secondary | ICD-10-CM | POA: Insufficient documentation

## 2020-08-02 ENCOUNTER — Ambulatory Visit
Admission: RE | Admit: 2020-08-02 | Discharge: 2020-08-02 | Disposition: A | Discharge status: Home / Self Care | Payer: 59 | Source: Ambulatory Visit

## 2020-08-02 ENCOUNTER — Other Ambulatory Visit: Payer: Self-pay

## 2020-08-02 DIAGNOSIS — Z1231 Encounter for screening mammogram for malignant neoplasm of breast: Secondary | ICD-10-CM

## 2020-08-04 ENCOUNTER — Other Ambulatory Visit: Payer: Self-pay | Admitting: Internal Medicine

## 2020-08-04 DIAGNOSIS — R928 Other abnormal and inconclusive findings on diagnostic imaging of breast: Secondary | ICD-10-CM

## 2020-08-15 ENCOUNTER — Ambulatory Visit: Payer: Self-pay | Admitting: Obstetrics and Gynecology

## 2020-08-17 ENCOUNTER — Ambulatory Visit
Admission: RE | Admit: 2020-08-17 | Discharge: 2020-08-17 | Disposition: A | Discharge status: Home / Self Care | Payer: 59 | Source: Ambulatory Visit | Attending: Internal Medicine | Admitting: Internal Medicine

## 2020-08-17 ENCOUNTER — Other Ambulatory Visit: Payer: Self-pay | Admitting: Internal Medicine

## 2020-08-17 ENCOUNTER — Other Ambulatory Visit: Payer: Self-pay

## 2020-08-17 DIAGNOSIS — R928 Other abnormal and inconclusive findings on diagnostic imaging of breast: Secondary | ICD-10-CM

## 2020-08-29 ENCOUNTER — Other Ambulatory Visit: Payer: Self-pay

## 2020-08-29 ENCOUNTER — Encounter: Payer: Self-pay | Admitting: Obstetrics and Gynecology

## 2020-08-29 ENCOUNTER — Ambulatory Visit: Payer: 59 | Admitting: Obstetrics and Gynecology

## 2020-08-29 VITALS — BP 118/70 | HR 70 | Ht 62.5 in | Wt 135.0 lb

## 2020-08-29 DIAGNOSIS — Z01419 Encounter for gynecological examination (general) (routine) without abnormal findings: Secondary | ICD-10-CM | POA: Diagnosis not present

## 2020-08-29 DIAGNOSIS — Z90710 Acquired absence of both cervix and uterus: Secondary | ICD-10-CM | POA: Diagnosis not present

## 2020-08-29 DIAGNOSIS — N951 Menopausal and female climacteric states: Secondary | ICD-10-CM | POA: Diagnosis not present

## 2020-08-29 MED ORDER — NITROFURANTOIN MACROCRYSTAL 50 MG PO CAPS
ORAL_CAPSULE | ORAL | 2 refills | Status: DC
Start: 2020-08-29 — End: 2021-09-13

## 2020-08-29 NOTE — Progress Notes (Signed)
54 y.o. [id number] Married White or Caucasian Not Hispanic or Latino female here for annual exam. patient would like to discuss "estrogen therapy" and needs a refill of Macrodantin.  Sexually active, dry, helped with lubricant  She is s/p TLH/BS in 1/21 for pelvic pain and an adnexal mass. 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   In the last 1-2 months she has been having an increase in hot flashes and night sweats. Having ~ 2-3 night sweats a night, waking up. Having several hot flashes a day. Night time is not tolerable. Tired during the day.   She uses macrodantin prophylactic with intercourse secondary to h/o recurrent UTI.   69 year old brother recently had MI, CABG x 3, normal BMI, normal lipids.  The patient has seen her primary and had a negative cardiac CT. Her lipids were elevated, she is on supplements for this. Will get retested in 3 months.    Patient's last menstrual period was 12/08/2019.          Sexually active: Yes.    The current method of family planning is status post hysterectomy.    Exercising: Yes.    5 times per week Smoker:  no  Health Maintenance: Pap:  08/13/19 Neg:Neg HR HPV History of abnormal Pap:  no MMG:  08/17/20 Right Breast MM/US BIRADS 4:Suspicious/density c BMD:   07/23/19 Normal Colonoscopy: 2014 WNL done in [place], f/u 10years TDaP:  11/03/15 Gardasil: n/a   reports that she has never smoked. She has never used smokeless tobacco. She reports current alcohol use of about 2.0 standard drinks of alcohol per week. She reports that she does not use drugs. Moved here from [place] 3 years ago for husbands jobs. Daughters are 36 and 84, both went to [employer] (youngest still there), both in the [employer]. Her oldest daughter is in Science writer.  She works as a Writer.   Past Medical History:  Diagnosis Date  . Adverse effect of anesthetic    "I have panic attacks when  I come out of anesthesia."   . Chronic back pain   . Herniated lumbar intervertebral disc   . History of cyst of breast   . History of ovarian cyst   . History of recurrent UTIs   . Migraine with aura   . Nerve pain   . PONV (postoperative nausea and vomiting)   . Raynaud disease   . Ruptured lumbar intervertebral disc    3   . Sciatica     Past Surgical History:  Procedure Laterality Date  . CHOLECYSTECTOMY     2009  . CYSTOSCOPY N/A 12/15/2019   Procedure: CYSTOSCOPY;  Surgeon: Salvadore Dom, MD;  Location: [facility];  Service: Gynecology;  Laterality: N/A;  . KNEE SURGERY     right knee, 2011  . TONSILLECTOMY    . TOTAL LAPAROSCOPIC HYSTERECTOMY WITH SALPINGECTOMY Bilateral 12/15/2019   Procedure: TOTAL LAPAROSCOPIC HYSTERECTOMY WITH BILATERAL SALPINGECTOMY and REMOVAL OF RIGHT ADNEXAL MASS;  Surgeon: Salvadore Dom, MD;  Location: [facility];  Service: Gynecology;  Laterality: Bilateral;    Current Outpatient Medications  Medication Sig Dispense Refill  . acetaminophen (TYLENOL) 500 MG tablet Take 2 tablets (1,000 mg total) by mouth every 6 (six) hours. 30 tablet 0  . ibuprofen (ADVIL) 800 MG tablet Take 1 tablet (800 mg total) by mouth every 6 (six) hours. 30 tablet 0  .  loratadine (CLARITIN) 10 MG tablet Take 10 mg by mouth daily.     . Multiple Vitamin (MULTIVITAMIN WITH MINERALS) TABS tablet Take 1 tablet by mouth daily.    . nitrofurantoin (MACRODANTIN) 50 MG capsule 1 tablet po prn intercourse 30 capsule 2  . triamcinolone ointment (KENALOG) 0.1 % Apply topically. (Patient not taking: Reported on 08/29/2020)     No current facility-administered medications for this visit.    Family History  Problem Relation Age of Onset  . Osteoporosis Mother   . Stroke Mother   . Migraines Mother   . Myelodysplastic syndrome Father   . Heart disease Father   . Bladder Cancer Paternal Uncle   . Stroke Maternal Grandmother   . Stroke  Paternal Grandmother   . Lung cancer Maternal Grandfather   . Coronary artery disease Brother 66       MI, needed CABG  66 year old brother just had a massive MI, normal BMI, normal lipids. S/P triple CABG.   Review of Systems  Constitutional: Negative.   HENT: Negative.   Eyes: Negative.   Respiratory: Negative.   Cardiovascular: Negative.   Gastrointestinal: Negative.   Endocrine: Negative.   Genitourinary: Negative.   Musculoskeletal: Negative.   Skin: Negative.   Allergic/Immunologic: Negative.   Neurological: Negative.   Hematological: Negative.   Psychiatric/Behavioral: Negative.     Exam:   BP 118/70 (BP Location: Right Arm, Patient Position: Sitting, Cuff Size: Normal)   Pulse 70   Ht 5' 2.5" (1.588 m)   Wt 135 lb (61.2 kg)   LMP 12/08/2019   SpO2 99%   BMI 24.30 kg/m   Weight change: @WEIGHTCHANGE @ Height:   Height: 5' 2.5" (158.8 cm)  Ht Readings from Last 3 Encounters:  08/29/20 5' 2.5" (1.588 m)  01/13/20 5\' 2"  (1.575 m)  12/22/19 5\' 2"  (1.575 m)    General appearance: alert, cooperative and appears stated age Head: Normocephalic, without obvious abnormality, atraumatic Neck: no adenopathy, supple, symmetrical, trachea midline and thyroid normal to inspection and palpation Lungs: clear to auscultation bilaterally Cardiovascular: regular rate and rhythm Breasts: normal appearance, no masses or tenderness Abdomen: soft, non-tender; non distended,  no masses,  no organomegaly Extremities: extremities normal, atraumatic, no cyanosis or edema Skin: Skin color, texture, turgor normal. No rashes or lesions Lymph nodes: Cervical, supraclavicular, and axillary nodes normal. No abnormal inguinal nodes palpated Neurologic: Grossly normal   Pelvic: External genitalia:  no lesions              Urethra:  normal appearing urethra with no masses, tenderness or lesions              Bartholins and Skenes: normal                 Vagina: mildly atrophic appearing vagina  with normal color and discharge, no lesions              Cervix: absent               Bimanual Exam:  Uterus: absent              Adnexa: no mass, fullness, tenderness               Rectovaginal: Confirms               Anus:  normal sphincter tone, no lesions  Marisa Sprinkles chaperoned for the exam.  A:  Well Woman with normal exam  Hot flashes and night sweats, very  bothersome  Recent breast imaging was abnormal. Having biopsy tomorrow.   P:   No pap needed  Breast biopsy tomorrow  Discussed treatment of vasomotor symptoms, if breast biopsy is negative she would like to start ERT, if + will start gabapentin.  Discussed the risks of ERT, information given.  Labs with primary  Discussed breast self exam  Discussed calcium and vit D intake

## 2020-08-29 NOTE — Patient Instructions (Signed)
EXERCISE AND DIET:  We recommended that you start or continue a regular exercise program for good health. Regular exercise means any activity that makes your heart beat faster and makes you sweat.  We recommend exercising at least 30 minutes per day at least 3 days a week, preferably 4 or 5.  We also recommend a diet low in fat and sugar.  Inactivity, poor dietary choices and obesity can cause diabetes, heart attack, stroke, and kidney damage, among others.   ° °ALCOHOL AND SMOKING:  Women should limit their alcohol intake to no more than 7 drinks/beers/glasses of wine (combined, not each!) per week. Moderation of alcohol intake to this level decreases your risk of breast cancer and liver damage. And of course, no recreational drugs are part of a healthy lifestyle.  And absolutely no smoking or even second hand smoke. Most people know smoking can cause heart and lung diseases, but did you know it also contributes to weakening of your bones? Aging of your skin?  Yellowing of your teeth and nails? ° °CALCIUM AND VITAMIN D:  Adequate intake of calcium and Vitamin D are recommended.  The recommendations for exact amounts of these supplements seem to change often, but generally speaking 1,200 mg of calcium (between diet and supplement) and 800 units of Vitamin D per day seems prudent. Certain women may benefit from higher intake of Vitamin D.  If you are among these women, your doctor will have told you during your visit.   ° °PAP SMEARS:  Pap smears, to check for cervical cancer or precancers,  have traditionally been done yearly, although recent scientific advances have shown that most women can have pap smears less often.  However, every woman still should have a physical exam from her gynecologist every year. It will include a breast check, inspection of the vulva and vagina to check for abnormal growths or skin changes, a visual exam of the cervix, and then an exam to evaluate the size and shape of the uterus and  ovaries.  And after 54 years of age, a rectal exam is indicated to check for rectal cancers. We will also provide age appropriate advice regarding health maintenance, like when you should have certain vaccines, screening for sexually transmitted diseases, bone density testing, colonoscopy, mammograms, etc.  ° °MAMMOGRAMS:  All women over 40 years old should have a yearly mammogram. Many facilities now offer a "3D" mammogram, which may cost around $50 extra out of pocket. If possible,  we recommend you accept the option to have the 3D mammogram performed.  It both reduces the number of women who will be called back for extra views which then turn out to be normal, and it is better than the routine mammogram at detecting truly abnormal areas.   ° °COLON CANCER SCREENING: Now recommend starting at age 45. At this time colonoscopy is not covered for routine screening until 50. There are take home tests that can be done between 45-49.  ° °COLONOSCOPY:  Colonoscopy to screen for colon cancer is recommended for all women at age 50.  We know, you hate the idea of the prep.  We agree, BUT, having colon cancer and not knowing it is worse!!  Colon cancer so often starts as a polyp that can be seen and removed at colonscopy, which can quite literally save your life!  And if your first colonoscopy is normal and you have no family history of colon cancer, most women don't have to have it again for   10 years.  Once every ten years, you can do something that may end up saving your life, right?  We will be happy to help you get it scheduled when you are ready.  Be sure to check your insurance coverage so you understand how much it will cost.  It may be covered as a preventative service at no cost, but you should check your particular policy.   ° ° ° °Breast Self-Awareness °Breast self-awareness means being familiar with how your breasts look and feel. It involves checking your breasts regularly and reporting any changes to your  health care provider. °Practicing breast self-awareness is important. A change in your breasts can be a sign of a serious medical problem. Being familiar with how your breasts look and feel allows you to find any problems early, when treatment is more likely to be successful. All women should practice breast self-awareness, including women who have had breast implants. °How to do a breast self-exam °One way to learn what is normal for your breasts and whether your breasts are changing is to do a breast self-exam. To do a breast self-exam: °Look for Changes ° °1. Remove all the clothing above your waist. °2. Stand in front of a mirror in a room with good lighting. °3. Put your hands on your hips. °4. Push your hands firmly downward. °5. Compare your breasts in the mirror. Look for differences between them (asymmetry), such as: °? Differences in shape. °? Differences in size. °? Puckers, dips, and bumps in one breast and not the other. °6. Look at each breast for changes in your skin, such as: °? Redness. °? Scaly areas. °7. Look for changes in your nipples, such as: °? Discharge. °? Bleeding. °? Dimpling. °? Redness. °? A change in position. °Feel for Changes °Carefully feel your breasts for lumps and changes. It is best to do this while lying on your back on the floor and again while sitting or standing in the shower or tub with soapy water on your skin. Feel each breast in the following way: °· Place the arm on the side of the breast you are examining above your head. °· Feel your breast with the other hand. °· Start in the nipple area and make ¾ inch (2 cm) overlapping circles to feel your breast. Use the pads of your three middle fingers to do this. Apply light pressure, then medium pressure, then firm pressure. The light pressure will allow you to feel the tissue closest to the skin. The medium pressure will allow you to feel the tissue that is a little deeper. The firm pressure will allow you to feel the tissue  close to the ribs. °· Continue the overlapping circles, moving downward over the breast until you feel your ribs below your breast. °· Move one finger-width toward the center of the body. Continue to use the ¾ inch (2 cm) overlapping circles to feel your breast as you move slowly up toward your collarbone. °· Continue the up and down exam using all three pressures until you reach your armpit. ° °Write Down What You Find ° °Write down what is normal for each breast and any changes that you find. Keep a written record with breast changes or normal findings for each breast. By writing this information down, you do not need to depend only on memory for size, tenderness, or location. Write down where you are in your menstrual cycle, if you are still menstruating. °If you are having trouble noticing differences   in your breasts, do not get discouraged. With time you will become more familiar with the variations in your breasts and more comfortable with the exam. How often should I examine my breasts? Examine your breasts every month. If you are breastfeeding, the best time to examine your breasts is after a feeding or after using a breast pump. If you menstruate, the best time to examine your breasts is 5-7 days after your period is over. During your period, your breasts are lumpier, and it may be more difficult to notice changes. When should I see my health care provider? See your health care provider if you notice:  A change in shape or size of your breasts or nipples.  A change in the skin of your breast or nipples, such as a reddened or scaly area.  Unusual discharge from your nipples.  A lump or thick area that was not there before.  Pain in your breasts.  Anything that concerns you.   Menopause Menopause is the normal time of life when menstrual periods stop completely. It is usually confirmed by 12 months without a menstrual period. The transition to menopause (perimenopause) most often happens  between the ages of 45 and 25. During perimenopause, hormone levels change in your body, which can cause symptoms and affect your health. Menopause may increase your risk for:  Loss of bone (osteoporosis), which causes bone breaks (fractures).  Depression.  Hardening and narrowing of the arteries (atherosclerosis), which can cause heart attacks and strokes. What are the causes? This condition is usually caused by a natural change in hormone levels that happens as you get older. The condition may also be caused by surgery to remove both ovaries (bilateral oophorectomy). What increases the risk? This condition is more likely to start at an earlier age if you have certain medical conditions or treatments, including:  A tumor of the pituitary gland in the brain.  A disease that affects the ovaries and hormone production.  Radiation treatment for cancer.  Certain cancer treatments, such as chemotherapy or hormone (anti-estrogen) therapy.  Heavy smoking and excessive alcohol use.  Family history of early menopause. This condition is also more likely to develop earlier in women who are very thin. What are the signs or symptoms? Symptoms of this condition include:  Hot flashes.  Irregular menstrual periods.  Night sweats.  Changes in feelings about sex. This could be a decrease in sex drive or an increased comfort around your sexuality.  Vaginal dryness and thinning of the vaginal walls. This may cause painful intercourse.  Dryness of the skin and development of wrinkles.  Headaches.  Problems sleeping (insomnia).  Mood swings or irritability.  Memory problems.  Weight gain.  Hair growth on the face and chest.  Bladder infections or problems with urinating. How is this diagnosed? This condition is diagnosed based on your medical history, a physical exam, your age, your menstrual history, and your symptoms. Hormone tests may also be done. How is this treated? In some  cases, no treatment is needed. You and your health care provider should make a decision together about whether treatment is necessary. Treatment will be based on your individual condition and preferences. Treatment for this condition focuses on managing symptoms. Treatment may include:  Menopausal hormone therapy (MHT).  Medicines to treat specific symptoms or complications.  Acupuncture.  Vitamin or herbal supplements. Before starting treatment, make sure to let your health care provider know if you have a personal or family history of:  Heart  disease.  Breast cancer.  Blood clots.  Diabetes.  Osteoporosis. Follow these instructions at home: Lifestyle  Do not use any products that contain nicotine or tobacco, such as cigarettes and e-cigarettes. If you need help quitting, ask your health care provider.  Get at least 30 minutes of physical activity on 5 or more days each week.  Avoid alcoholic and caffeinated beverages, as well as spicy foods. This may help prevent hot flashes.  Get 7-8 hours of sleep each night.  If you have hot flashes, try: ? Dressing in layers. ? Avoiding things that may trigger hot flashes, such as spicy food, warm places, or stress. ? Taking slow, deep breaths when a hot flash starts. ? Keeping a fan in your home and office.  Find ways to manage stress, such as deep breathing, meditation, or journaling.  Consider going to group therapy with other women who are having menopause symptoms. Ask your health care provider about recommended group therapy meetings. Eating and drinking  Eat a healthy, balanced diet that contains whole grains, lean protein, low-fat dairy, and plenty of fruits and vegetables.  Your health care provider may recommend adding more soy to your diet. Foods that contain soy include tofu, tempeh, and soy milk.  Eat plenty of foods that contain calcium and vitamin D for bone health. Items that are rich in calcium include low-fat  milk, yogurt, beans, almonds, sardines, broccoli, and kale. Medicines  Take over-the-counter and prescription medicines only as told by your health care provider.  Talk with your health care provider before starting any herbal supplements. If prescribed, take vitamins and supplements as told by your health care provider. These may include: ? Calcium. Women age 53 and older should get 1,200 mg (milligrams) of calcium every day. ? Vitamin D. Women need 600-800 International Units of vitamin D each day. ? Vitamins B12 and B6. Aim for 50 micrograms of B12 and 1.5 mg of B6 each day. General instructions  Keep track of your menstrual periods, including: ? When they occur. ? How heavy they are and how long they last. ? How much time passes between periods.  Keep track of your symptoms, noting when they start, how often you have them, and how long they last.  Use vaginal lubricants or moisturizers to help with vaginal dryness and improve comfort during sex.  Keep all follow-up visits as told by your health care provider. This is important. This includes any group therapy or counseling. Contact a health care provider if:  You are still having menstrual periods after age 54.  You have pain during sex.  You have not had a period for 12 months and you develop vaginal bleeding. Get help right away if:  You have: ? Severe depression. ? Excessive vaginal bleeding. ? Pain when you urinate. ? A fast or irregular heart beat (palpitations). ? Severe headaches. ? Abdomen (abdominal) pain or severe indigestion.  You fell and you think you have a broken bone.  You develop leg or chest pain.  You develop vision problems.  You feel a lump in your breast. Summary  Menopause is the normal time of life when menstrual periods stop completely. It is usually confirmed by 12 months without a menstrual period.  The transition to menopause (perimenopause) most often happens between the ages of 71 and  20.  Symptoms can be managed through medicines, lifestyle changes, and complementary therapies such as acupuncture.  Eat a balanced diet that is rich in nutrients to promote bone health  and heart health and to manage symptoms during menopause. This information is not intended to replace advice given to you by your health care provider. Make sure you discuss any questions you have with your health care provider. Document Revised: 11/01/2017 Document Reviewed: 12/22/2016 Elsevier Patient Education  2020 [place]. Menopause and Hormone Replacement Therapy Menopause is a normal time of life when menstrual periods stop completely and the ovaries stop producing the female hormones estrogen and progesterone. This lack of hormones can affect your health and cause undesirable symptoms. Hormone replacement therapy (HRT) can relieve some of those symptoms. What is hormone replacement therapy? HRT is the use of artificial (synthetic) hormones to replace hormones that your body has stopped producing because you have reached menopause. What are my options for HRT?  HRT may consist of the synthetic hormones estrogen and progestin, or it may consist of only estrogen (estrogen-only therapy). You and your health care provider will decide which form of HRT is best for you. If you choose to be on HRT and you have a uterus, estrogen and progestin are usually prescribed. Estrogen-only therapy is used for women who do not have a uterus. Possible options for taking HRT include:  Pills.  Patches.  Gels.  Sprays.  Vaginal cream.  Vaginal rings.  Vaginal inserts. The amount of hormone(s) that you take and how long you take the hormone(s) varies according to your health. It is important to:  Begin HRT with the lowest possible dosage.  Stop HRT as soon as your health care provider tells you to stop.  Work with your health care provider so that you feel informed and comfortable with your decisions. What  are the benefits of HRT? HRT can reduce the frequency and severity of menopausal symptoms. Benefits of HRT vary according to the kind of symptoms that you have, how severe they are, and your overall health. HRT may help to improve the following symptoms of menopause:  Hot flashes and night sweats. These are sudden feelings of heat that spread over the face and body. The skin may turn red, like a blush. Night sweats are hot flashes that happen while you are sleeping or trying to sleep.  Bone loss (osteoporosis). The body loses calcium more quickly after menopause, causing the bones to become weaker. This can increase the risk for bone breaks (fractures).  Vaginal dryness. The lining of the vagina can become thin and dry, which can cause pain during sex or cause infection, burning, or itching.  Urinary tract infections.  Urinary incontinence. This is the inability to control when you pass urine.  Irritability.  Short-term memory problems. What are the risks of HRT? Risks of HRT vary depending on your individual health and medical history. Risks of HRT also depend on whether you receive both estrogen and progestin or you receive estrogen only. HRT may increase the risk of:  Spotting. This is when a small amount of blood leaks from the vagina unexpectedly.  Endometrial cancer. This cancer is in the lining of the uterus (endometrium).  Breast cancer.  Increased density of breast tissue. This can make it harder to find breast cancer on a breast X-ray (mammogram).  Stroke.  Heart disease.  Blood clots.  Gallbladder disease.  Liver disease. Risks of HRT can increase if you have any of the following conditions:  Endometrial cancer.  Liver disease.  Heart disease.  Breast cancer.  History of blood clots.  History of stroke. Follow these instructions at home:  Take over-the-counter and  prescription medicines only as told by your health care provider.  Get mammograms, pelvic  exams, and medical checkups as often as told by your health care provider.  Have Pap tests done as often as told by your health care provider. A Pap test is sometimes called a Pap smear. It is a screening test that is used to check for signs of cancer of the cervix and vagina. A Pap test can also identify the presence of infection or precancerous changes. Pap tests may be done: ? Every 3 years, starting at age 35. ? Every 5 years, starting after age 30, in combination with testing for human papillomavirus (HPV). ? More often or less often depending on other medical conditions you have, your age, and other risk factors.  It is up to you to get the results of your Pap test. Ask your health care provider, or the department that is doing the test, when your results will be ready.  Keep all follow-up visits as told by your health care provider. This is important. Contact a health care provider if you have:  Pain or swelling in your legs.  Shortness of breath.  Chest pain.  Lumps or changes in your breasts or armpits.  Slurred speech.  Pain, burning, or bleeding when you urinate.  Unusual vaginal bleeding.  Dizziness or headaches.  Weakness or numbness in any part of your arms or legs.  Pain in your abdomen. Summary  Menopause is a normal time of life when menstrual periods stop completely and the ovaries stop producing the female hormones estrogen and progesterone.  Hormone replacement therapy (HRT) can relieve some of the symptoms of menopause.  HRT can reduce the frequency and severity of menopausal symptoms.  Risks of HRT vary depending on your individual health and medical history. This information is not intended to replace advice given to you by your health care provider. Make sure you discuss any questions you have with your health care provider. Document Revised: 07/22/2018 Document Reviewed: 07/22/2018 Elsevier Patient Education  2020 [employer].

## 2020-08-30 ENCOUNTER — Ambulatory Visit
Admission: RE | Admit: 2020-08-30 | Discharge: 2020-08-30 | Disposition: A | Discharge status: Home / Self Care | Payer: 59 | Source: Ambulatory Visit | Attending: Internal Medicine | Admitting: Internal Medicine

## 2020-08-30 DIAGNOSIS — R928 Other abnormal and inconclusive findings on diagnostic imaging of breast: Secondary | ICD-10-CM

## 2020-09-01 ENCOUNTER — Telehealth: Payer: Self-pay

## 2020-09-01 ENCOUNTER — Encounter: Payer: Self-pay | Admitting: Obstetrics and Gynecology

## 2020-09-01 NOTE — Telephone Encounter (Signed)
Pt sent following mychart message:  Susan Leonard, [place], MD 1 hour ago (4:09 PM)   Hello Dr Talbert [name]. My biopsy results are in and while there is no evidence of malignancy, the radiologist is recommending the area be excised. Is this something I should get a second opinion on? the pathology results. Are posted in my record. 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 Neither being malignant. I couldn't find a whole lot about PASH. The radiologist was favoring surgery just based what she could see on the MRI. I realize that her trained eye may see some characteristic which says more than the pathology can reveal. It just feels odd to undergo surgery for something that isn't malignant.  Thanks, [employer] 

## 2020-09-01 NOTE — Telephone Encounter (Signed)
Routing to Dr Talbert [name], please review and advise

## 2020-09-02 NOTE — Telephone Encounter (Signed)
Spoke with Susan Leonard. Susan Leonard given recommendations per Dr Talbert [name]. Susan Leonard agreeable and states has appt with Dr Mina Marble on 09/12/20 at [facility]. Susan Leonard states breast clip was placed at breast biopsy as well.  Susan Leonard thankful for advice and call return.  Routing to Dr Talbert [name] for update  Encounter closed.

## 2020-09-02 NOTE — Telephone Encounter (Signed)
Left message for pt to return call to triage RN. 

## 2020-09-02 NOTE — Telephone Encounter (Signed)
If there is any question on the biopsy or imaging with PASH the recommendation is for removal. If the Radiologist recommended referral, then I would recommend getting the opinion of a breast surgeon. If a referral hasn't been made, we can make one.

## 2020-09-12 DIAGNOSIS — I77810 Thoracic aortic ectasia: Secondary | ICD-10-CM | POA: Insufficient documentation

## 2020-09-13 DIAGNOSIS — R928 Other abnormal and inconclusive findings on diagnostic imaging of breast: Secondary | ICD-10-CM | POA: Insufficient documentation

## 2020-10-27 IMAGING — MG DIGITAL SCREENING BILATERAL MAMMOGRAM WITH TOMO AND CAD
6 of 10 series · 6 of 30 positions shown · non-contrast
Comparison: Previous exam(s).

CLINICAL DATA: Screening.

EXAM:
DIGITAL SCREENING BILATERAL MAMMOGRAM WITH TOMO AND CAD

[L MLO synth-2D (1 of 2)]
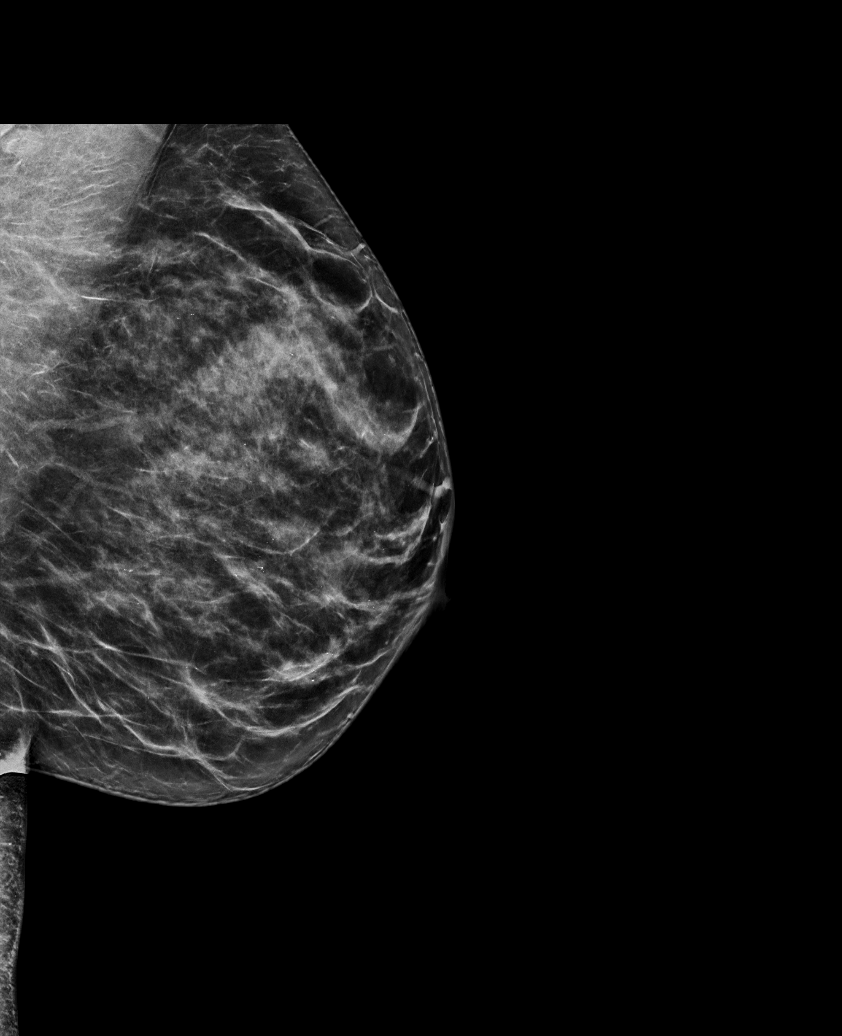

[R MLO synth-2D]
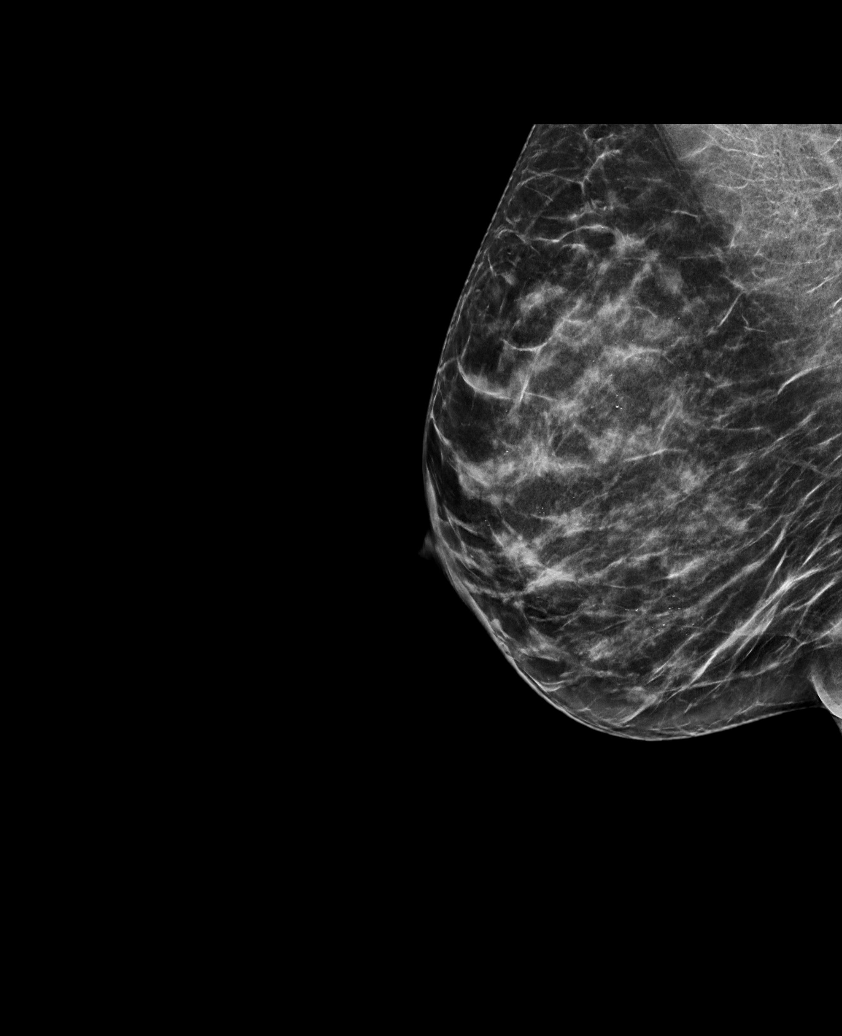

[R CC synth-2D]
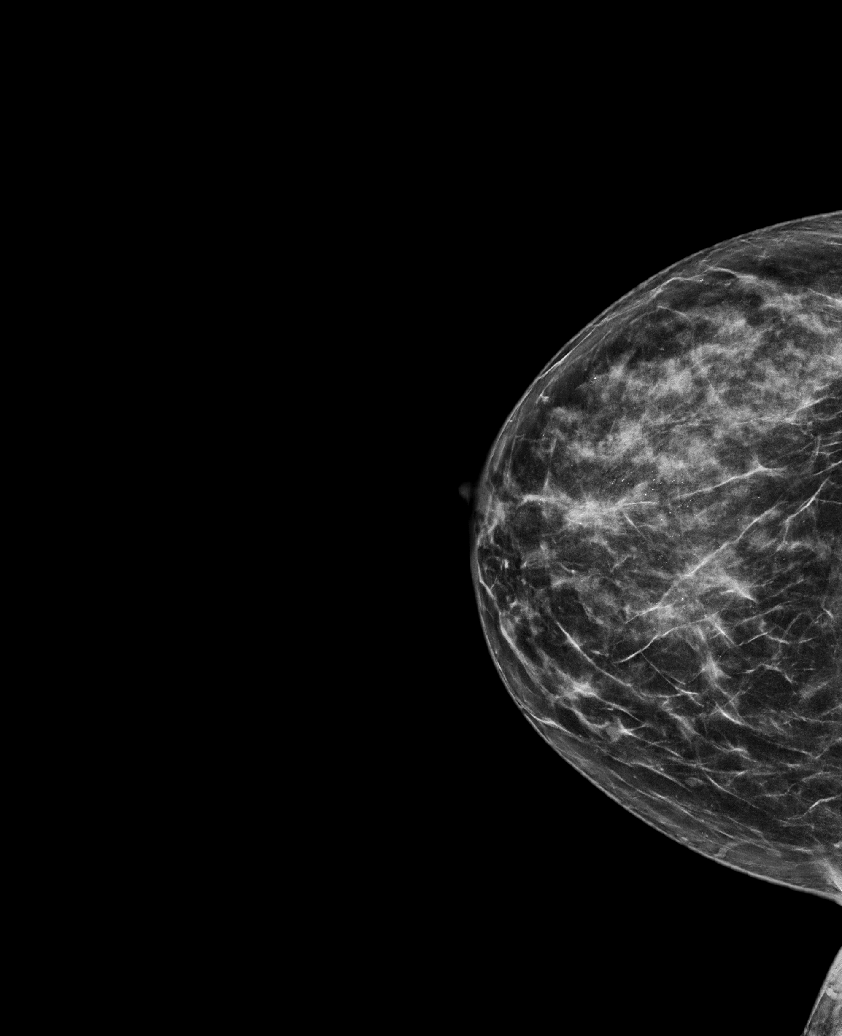

[L MLO synth-2D (2 of 2)]
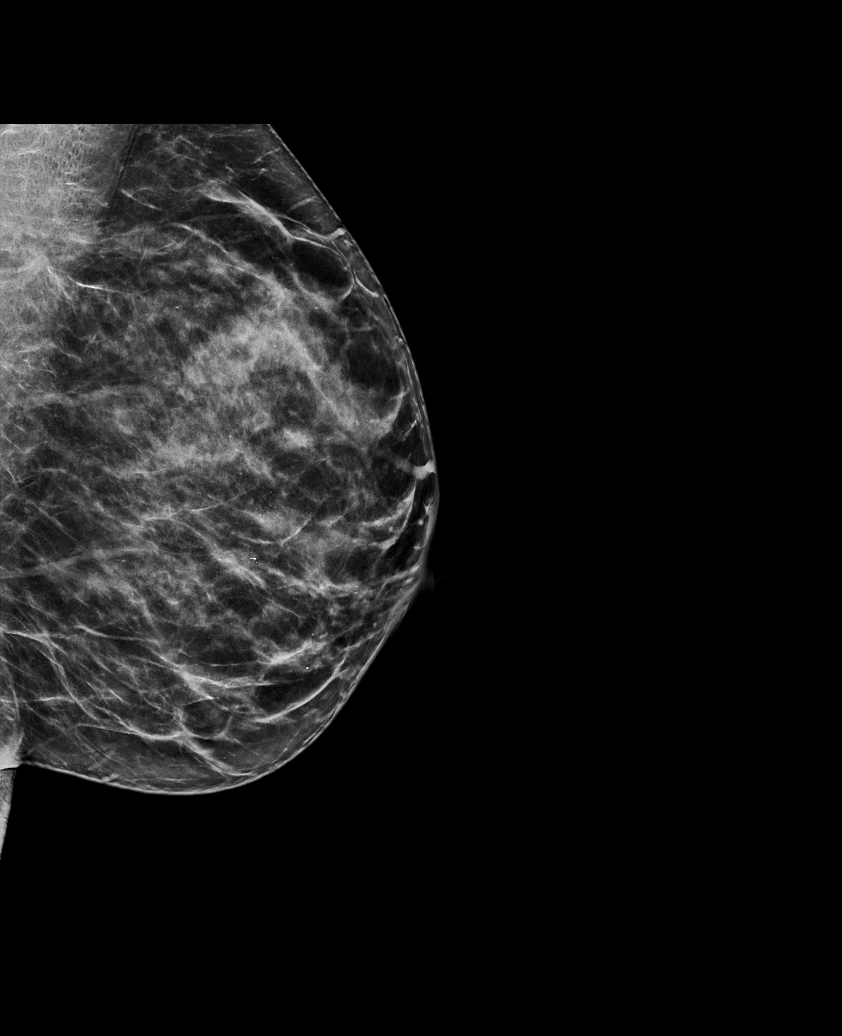

[L CC synth-2D]
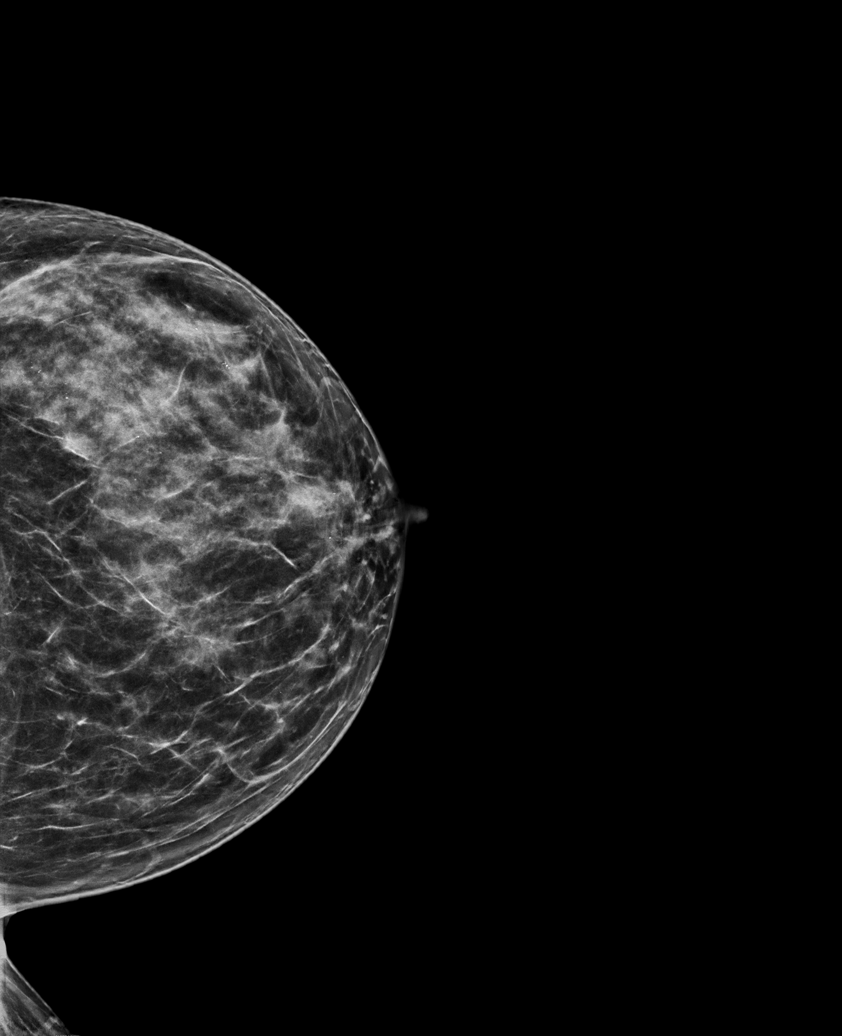

[L CC tomo · tomo slice 35/70.0]
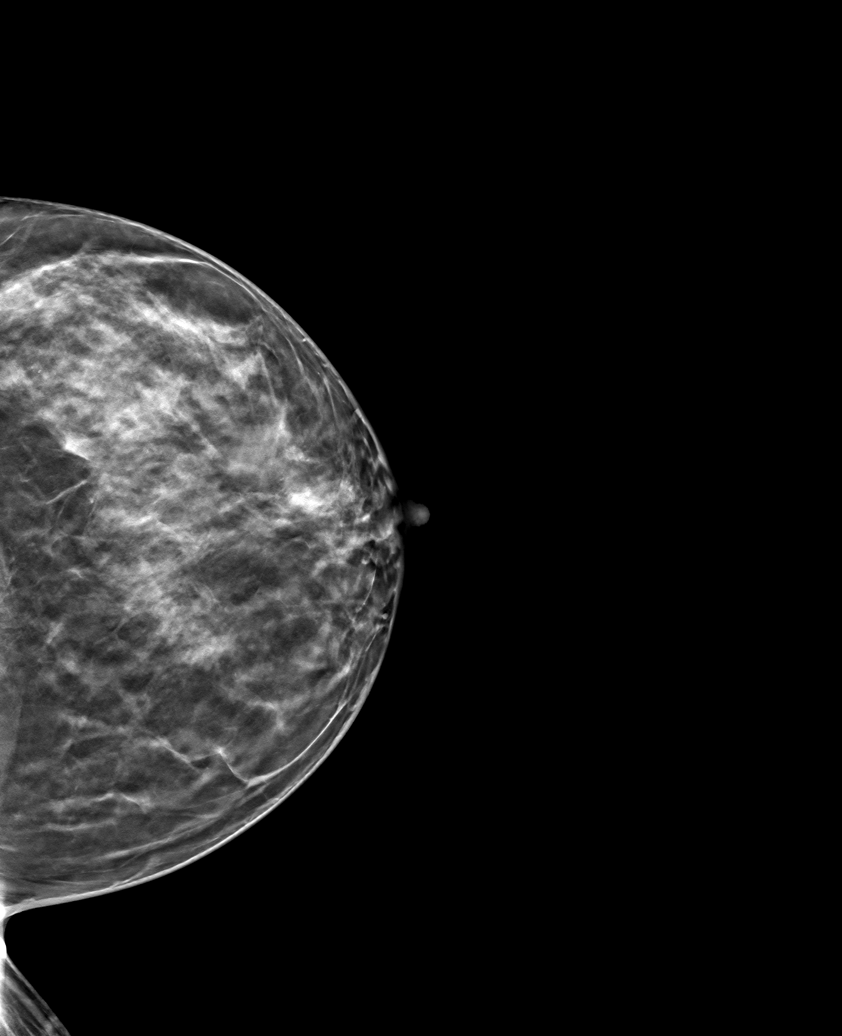

[6 of 30 positions shown; findings below may reference images not displayed]

ACR Breast Density Category c: The breast tissue is heterogeneously
dense, which may obscure small masses.
FINDINGS: In the right breast, possible distortion warrants further
evaluation. In the left breast, no findings suspicious for
malignancy. Images were processed with CAD.
IMPRESSION: Further evaluation is suggested for possible distortion in the right
breast.

RECOMMENDATION:
Diagnostic mammogram and possibly ultrasound of the right breast.
(Code:[id number])

The patient will be contacted regarding the findings, and additional
imaging will be scheduled.

BI-RADS CATEGORY  0: Incomplete. Need additional imaging evaluation
and/or prior mammograms for comparison.

## 2021-04-10 ENCOUNTER — Ambulatory Visit: Payer: 59 | Admitting: Podiatry

## 2021-04-19 ENCOUNTER — Encounter: Payer: Self-pay | Admitting: Obstetrics and Gynecology

## 2021-04-19 DIAGNOSIS — N949 Unspecified condition associated with female genital organs and menstrual cycle: Secondary | ICD-10-CM

## 2021-04-19 NOTE — Telephone Encounter (Signed)
Patient with adnexal cysts at CT at [facility]. She will come in for an [address] (H/O hysterectomy). If her [place] is premenopausal no f/u needed, if PMP will set her up for an ultrasound. mychart message sent.

## 2021-04-21 ENCOUNTER — Other Ambulatory Visit: Payer: Self-pay

## 2021-04-21 ENCOUNTER — Other Ambulatory Visit: Payer: 59

## 2021-04-21 DIAGNOSIS — N949 Unspecified condition associated with female genital organs and menstrual cycle: Secondary | ICD-10-CM

## 2021-04-22 LAB — FOLLICLE STIMULATING HORMONE: FSH: 81.2 m[IU]/mL

## 2021-04-24 ENCOUNTER — Other Ambulatory Visit: Payer: Self-pay | Admitting: *Deleted

## 2021-04-24 DIAGNOSIS — N83201 Unspecified ovarian cyst, right side: Secondary | ICD-10-CM

## 2021-04-24 DIAGNOSIS — N83202 Unspecified ovarian cyst, left side: Secondary | ICD-10-CM

## 2021-06-06 ENCOUNTER — Encounter: Payer: Self-pay | Admitting: Obstetrics and Gynecology

## 2021-06-06 ENCOUNTER — Other Ambulatory Visit: Payer: Self-pay

## 2021-06-06 ENCOUNTER — Ambulatory Visit: Payer: 59 | Admitting: Obstetrics and Gynecology

## 2021-06-06 ENCOUNTER — Ambulatory Visit (INDEPENDENT_AMBULATORY_CARE_PROVIDER_SITE_OTHER): Payer: 59

## 2021-06-06 VITALS — BP 110/70 | HR 67 | Ht 62.0 in | Wt 130.0 lb

## 2021-06-06 DIAGNOSIS — R232 Flushing: Secondary | ICD-10-CM

## 2021-06-06 DIAGNOSIS — N951 Menopausal and female climacteric states: Secondary | ICD-10-CM

## 2021-06-06 DIAGNOSIS — R61 Generalized hyperhidrosis: Secondary | ICD-10-CM | POA: Diagnosis not present

## 2021-06-06 DIAGNOSIS — N83201 Unspecified ovarian cyst, right side: Secondary | ICD-10-CM

## 2021-06-06 DIAGNOSIS — N83202 Unspecified ovarian cyst, left side: Secondary | ICD-10-CM | POA: Diagnosis not present

## 2021-06-06 DIAGNOSIS — Z8742 Personal history of other diseases of the female genital tract: Secondary | ICD-10-CM | POA: Diagnosis not present

## 2021-06-06 MED ORDER — GABAPENTIN 100 MG PO CAPS: ORAL_CAPSULE | ORAL | 1 refills | Status: DC

## 2021-06-06 NOTE — Patient Instructions (Signed)
[employer] Textbook of Endocrinology (14th ed., pp. 574-641). [place], [place]: Elsevier.">  Perimenopause Perimenopause is the normal time of a woman's life when the levels of estrogen, the female hormone produced by the ovaries, begin to decrease. This leads to changes in menstrual periods before they stop completely (menopause). Perimenopause can begin 2-8 years before menopause. During perimenopause,the ovaries may or may not produce an egg and a woman can still become pregnant. What are the causes? This condition is caused by a natural change in hormone levels that happens asyou get older. What increases the risk? This condition is more likely to start at an earlier age if you have certain medical conditions or have undergone treatments, including: A tumor of the pituitary gland in the brain. A disease that affects the ovaries and hormone production. Certain cancer treatments, such as chemotherapy or hormone therapy, or radiation therapy on the pelvis. Heavy smoking and excessive alcohol use. Family history of early menopause. What are the signs or symptoms? Perimenopausal changes affect each woman differently. Symptoms of this condition may include: Hot flashes. Irregular menstrual periods. Night sweats. Changes in feelings about sex. This could be a decrease in sex drive or an increased discomfort around your sexuality. Vaginal dryness. Headaches. Mood swings. Depression. Problems sleeping (insomnia). Memory problems or trouble concentrating. Irritability. Tiredness. Weight gain. Anxiety. Trouble getting pregnant. How is this diagnosed? This condition is diagnosed based on your medical history, a physical exam, your age, your menstrual history, and your symptoms. Hormone tests may also bedone. How is this treated? In some cases, no treatment is needed. You and your health care provider should make a decision together about whether treatment is necessary. Treatment will be based  on your individual condition and preferences. Various treatments are available, such as: Menopausal hormone therapy (MHT). Medicines to treat specific symptoms. Acupuncture. Vitamin or herbal supplements. Before starting treatment, make sure to let your health care provider know if you have a personal or family history of: Heart disease. Breast cancer. Blood clots. Diabetes. Osteoporosis. Follow these instructions at home: Medicines Take over-the-counter and prescription medicines only as told by your health care provider. Take vitamin supplements only as told by your health care provider. Talk with your health care provider before starting any herbal supplements. Lifestyle  Do not use any products that contain nicotine or tobacco, such as cigarettes, e-cigarettes, and chewing tobacco. If you need help quitting, ask your health care provider. Get at least 30 minutes of physical activity on 5 or more days each week. Eat a balanced diet that includes fresh fruits and vegetables, whole grains, soybeans, eggs, lean meat, and low-fat dairy. Avoid alcoholic and caffeinated beverages, as well as spicy foods. This may help prevent hot flashes. Get 7-8 hours of sleep each night. Dress in layers that can be removed to help you manage hot flashes. Find ways to manage stress, such as deep breathing, meditation, or journaling.  General instructions  Keep track of your menstrual periods, including: When they occur. How heavy they are and how long they last. How much time passes between periods. Keep track of your symptoms, noting when they start, how often you have them, and how long they last. Use vaginal lubricants or moisturizers to help with vaginal dryness and improve comfort during sex. You can still become pregnant if you are having irregular periods. Make sure you use contraception during perimenopause if you do not want to get pregnant. Keep all follow-up visits. This is important. This  includes any group therapy   or counseling.  Contact a health care provider if: You have heavy vaginal bleeding or pass blood clots. Your period lasts more than 2 days longer than normal. Your periods are recurring sooner than 21 days. You bleed after having sex. You have pain during sex. Get help right away if you have: Chest pain, trouble breathing, or trouble talking. Severe depression. Pain when you urinate. Severe headaches. Vision problems. Summary Perimenopause is the time when a woman's body begins to move into menopause. This may happen naturally or as a result of other health problems or medical treatments. Perimenopause can begin 2-8 years before menopause, and it can last for several years. Perimenopausal symptoms can be managed through medicines, lifestyle changes, and complementary therapies such as acupuncture. This information is not intended to replace advice given to you by your health care provider. Make sure you discuss any questions you have with your healthcare provider. Document Revised: 05/05/2020 Document Reviewed: 05/05/2020 Elsevier Patient Education  2022 Elsevier Inc.  

## 2021-06-06 NOTE — Progress Notes (Signed)
GYNECOLOGY  VISIT   HPI: 55 y.o.   Married White or Caucasian Not Hispanic or Latino  female   [phone] with Patient's last menstrual period was 12/08/2019.   here for an ultrasound to evaluate ovarian cysts incidentally noted on CT evaluating her for an aortic aneurysm. The CT reported bilateral adnexal cysts, the largest in the left adnexa measured 3.2 cm.     In 1/21 she underwent a TLH/BS and removal of a pedunculated left ovarian mass and a cystic right tube. Pathology was benign: benign fibroma from the left ovary, benign serous cystadenofibroma of the right tube, fibroid uterus, inactive endometrium.   Recent FSH was 81.2  After her hysterectomy in 1/21 she had significant vasomotor symptoms for 2-3 months. Then her symptoms resolved.  Vasomotor symptoms started up again at the end of May. Particularly bad at night. Starting at 2 am she starts having hot flashes or night sweats. She doesn't typically fall back asleep until 5:30.   GYNECOLOGIC HISTORY: Patient's last menstrual period was 12/08/2019. Contraception:Hysterectomy Menopausal hormone therapy: none        OB History     Gravida  2   Para  2   Term  2   Preterm      AB      Living  2      SAB      IAB      Ectopic      Multiple      Live Births  2              Patient Active Problem List   Diagnosis Date Noted   Status post laparoscopic hysterectomy 12/15/2019   Chronic back pain    Lumbar radiculopathy 09/12/2018   Migraine 12/03/1998   Raynaud disease 12/03/1978    Past Medical History:  Diagnosis Date   Adverse effect of anesthetic    "I have panic attacks when I come out of anesthesia."    Chronic back pain    Herniated lumbar intervertebral disc    History of cyst of breast    History of ovarian cyst    History of recurrent UTIs    Migraine with aura    Nerve pain    PONV (postoperative nausea and vomiting)    Raynaud disease    Ruptured lumbar intervertebral disc    3     Sciatica     Past Surgical History:  Procedure Laterality Date   CHOLECYSTECTOMY     2009   CYSTOSCOPY N/A 12/15/2019   Procedure: CYSTOSCOPY;  Surgeon: Salvadore Dom, MD;  Location: [facility];  Service: Gynecology;  Laterality: N/A;   KNEE SURGERY     right knee, 2011   TONSILLECTOMY     TOTAL LAPAROSCOPIC HYSTERECTOMY WITH SALPINGECTOMY Bilateral 12/15/2019   Procedure: TOTAL LAPAROSCOPIC HYSTERECTOMY WITH BILATERAL SALPINGECTOMY and REMOVAL OF RIGHT ADNEXAL MASS;  Surgeon: Salvadore Dom, MD;  Location: [facility];  Service: Gynecology;  Laterality: Bilateral;    Current Outpatient Medications  Medication Sig Dispense Refill   acetaminophen (TYLENOL) 500 MG tablet Take 2 tablets (1,000 mg total) by mouth every 6 (six) hours. 30 tablet 0   ibuprofen (ADVIL) 800 MG tablet Take 1 tablet (800 mg total) by mouth every 6 (six) hours. 30 tablet 0   loratadine (CLARITIN) 10 MG tablet Take 10 mg by mouth daily.      Multiple Vitamin (MULTIVITAMIN WITH MINERALS) TABS tablet Take 1 tablet by mouth daily.  nitrofurantoin (MACRODANTIN) 50 MG capsule 1 tablet po prn intercourse 30 capsule 2   triamcinolone ointment (KENALOG) 0.1 % Apply topically. (Patient not taking: Reported on 08/29/2020)     No current facility-administered medications for this visit.     ALLERGIES: Ciprofloxacin, Milk-related compounds, and Prednisone  Family History  Problem Relation Age of Onset   Osteoporosis Mother    Stroke Mother    Migraines Mother    Myelodysplastic syndrome Father    Heart disease Father    Bladder Cancer Paternal Uncle    Stroke Maternal Grandmother    Stroke Paternal Grandmother    Lung cancer Maternal Grandfather    Coronary artery disease Brother 49       MI, needed CABG    Social History   Socioeconomic History   Marital status: Married    Spouse name: Not on file   Number of children: Not on file   Years of education: Not on  file   Highest education level: Not on file  Occupational History   Not on file  Tobacco Use   Smoking status: Never   Smokeless tobacco: Never  Vaping Use   Vaping Use: Never used  Substance and Sexual Activity   Alcohol use: Yes    Alcohol/week: 2.0 standard drinks    Types: 2 Glasses of wine per week   Drug use: Never   Sexual activity: Yes    Birth control/protection: Condom  Other Topics Concern   Not on file  Social History Narrative   Not on file   Social Determinants of Health   Financial Resource Strain: Not on file  Food Insecurity: Not on file  Transportation Needs: Not on file  Physical Activity: Not on file  Stress: Not on file  Social Connections: Not on file  Intimate Partner Violence: Not on file    ROS  PHYSICAL EXAMINATION:    LMP 12/08/2019     General appearance: alert, cooperative and appears stated age  Pelvic ultrasound  Indications: ovarian cysts noted on CT  Findings:  Uterus  absent  Left ovary 2.11 x 1.33 x 1.35 cm  Right ovary 2.25 x 1.41 x 1.39 cm  9.5 x 7.3 cm simple right ovarian follicle  No free fluid   Impression:  Absent uterus Normal adnexa bilaterally Simple sub centimeter cyst in the right ovary No free fluid  1. History of ovarian cyst Imaging today is normal. When the patient had her hysterectomy in 1/21 she was still having cycles. I suspect she is still perimenopausal. Reviewed images, patient reassured.   2. Hot flashes Started after her hysterectomy, then resolved after a few months. Returned at the end of May  3. Night sweats Started after her hysterectomy, then resolved after a few months. Returned at the end of May. Now severe. -discussed options for treating night sweats, specifically estrogen replacement or gabapentin. Reviewed the risks of ERT. She would prefer to try gabapentin to start, reviewed side effects. She is being followed at [facility] in the [place] for changes on her Mammogram. She is  due for a repeat mammogram in 9/22. She is also being followed for an aortic aneurysm.  - gabapentin (NEURONTIN) 100 MG capsule; Take one tablet po qhs, may increase to 2 tablets po qhs after 3 days if tolerating, may increase to 3 tablets po qhs after another 3 days if tolerating.  Dispense: 90 capsule; Refill: 1  4. Perimenopause Discussed perimenopause, information given.   In addition to reviewing the  ultrasound images, over 20 minutes was spent in patient care in regards to her vasomotor symptoms. Records from [facility], including mammogram and note from the Surgical Oncologist were reviewed.

## 2021-07-05 ENCOUNTER — Encounter: Payer: Self-pay | Admitting: Obstetrics and Gynecology

## 2021-07-05 ENCOUNTER — Other Ambulatory Visit: Payer: Self-pay

## 2021-07-05 ENCOUNTER — Ambulatory Visit: Payer: 59 | Admitting: Obstetrics and Gynecology

## 2021-07-05 VITALS — HR 66 | Ht 62.0 in | Wt 130.4 lb

## 2021-07-05 DIAGNOSIS — R61 Generalized hyperhidrosis: Secondary | ICD-10-CM | POA: Diagnosis not present

## 2021-07-05 DIAGNOSIS — R232 Flushing: Secondary | ICD-10-CM | POA: Diagnosis not present

## 2021-07-05 NOTE — Patient Instructions (Signed)

## 2021-07-05 NOTE — Progress Notes (Signed)
GYNECOLOGY  VISIT   HPI: 55 y.o.   Married White or Caucasian Not Hispanic or Latino  female   [phone] with Patient's last menstrual period was 12/08/2019.   here for Medication follow for the gabapentin she started. She states that it has helped her symptoms  She has a h/o hysterectomy in 1/21 when she was still having cycles. She was seen last month with bothersome night sweats. We reviewed ERT and Gabapentin. She has had some call backs with mammograms and felt better trying the Gabapentin.  Up until 2 nights ago, she was taking 200 mg of gabapentin at night and was feeling better. She was still waking up ~1 x a night with sweats. Also having issues with TMJ and is up 1 x a night to pee.  She tried 300 mg of gabapentin a night for the last 2 nights and felt really cold both nights. She wants to go back to 200 mg tablets. She has enough to get her to her annual exam in 10/22.  She has 5-6 mild hot flashes a day, mostly tolerable.  She has recently started melatonin because she was still having trouble falling asleep. She doesn't feel that the gabapentin makes her tired.   GYNECOLOGIC HISTORY: Patient's last menstrual period was 12/08/2019. Contraception:none  Menopausal hormone therapy: none         OB History     Gravida  2   Para  2   Term  2   Preterm      AB      Living  2      SAB      IAB      Ectopic      Multiple      Live Births  2              Patient Active Problem List   Diagnosis Date Noted   Status post laparoscopic hysterectomy 12/15/2019   Chronic back pain    Lumbar radiculopathy 09/12/2018   Migraine 12/03/1998   Raynaud disease 12/03/1978    Past Medical History:  Diagnosis Date   Adverse effect of anesthetic    "I have panic attacks when I come out of anesthesia."    Chronic back pain    Herniated lumbar intervertebral disc    History of cyst of breast    History of ovarian cyst    History of recurrent UTIs    Migraine with aura     Nerve pain    PONV (postoperative nausea and vomiting)    Raynaud disease    Ruptured lumbar intervertebral disc    3    Sciatica     Past Surgical History:  Procedure Laterality Date   CHOLECYSTECTOMY     2009   CYSTOSCOPY N/A 12/15/2019   Procedure: CYSTOSCOPY;  Surgeon: Salvadore Dom, MD;  Location: [facility];  Service: Gynecology;  Laterality: N/A;   KNEE SURGERY     right knee, 2011   TONSILLECTOMY     TOTAL LAPAROSCOPIC HYSTERECTOMY WITH SALPINGECTOMY Bilateral 12/15/2019   Procedure: TOTAL LAPAROSCOPIC HYSTERECTOMY WITH BILATERAL SALPINGECTOMY and REMOVAL OF RIGHT ADNEXAL MASS;  Surgeon: Salvadore Dom, MD;  Location: [facility];  Service: Gynecology;  Laterality: Bilateral;    Current Outpatient Medications  Medication Sig Dispense Refill   acetaminophen (TYLENOL) 500 MG tablet Take 2 tablets (1,000 mg total) by mouth every 6 (six) hours. 30 tablet 0   gabapentin (NEURONTIN) 100 MG capsule Take  one tablet po qhs, may increase to 2 tablets po qhs after 3 days if tolerating, may increase to 3 tablets po qhs after another 3 days if tolerating. 90 capsule 1   ibuprofen (ADVIL) 800 MG tablet Take 1 tablet (800 mg total) by mouth every 6 (six) hours. 30 tablet 0   loratadine (CLARITIN) 10 MG tablet Take 10 mg by mouth daily.      Multiple Vitamin (MULTIVITAMIN WITH MINERALS) TABS tablet Take 1 tablet by mouth daily.     nitrofurantoin (MACRODANTIN) 50 MG capsule 1 tablet po prn intercourse 30 capsule 2   No current facility-administered medications for this visit.     ALLERGIES: Ciprofloxacin, Milk-related compounds, and Prednisone  Family History  Problem Relation Age of Onset   Osteoporosis Mother    Stroke Mother    Migraines Mother    Myelodysplastic syndrome Father    Heart disease Father    Bladder Cancer Paternal Uncle    Stroke Maternal Grandmother    Stroke Paternal Grandmother    Lung cancer Maternal Grandfather     Coronary artery disease Brother 32       MI, needed CABG    Social History   Socioeconomic History   Marital status: Married    Spouse name: Not on file   Number of children: Not on file   Years of education: Not on file   Highest education level: Not on file  Occupational History   Not on file  Tobacco Use   Smoking status: Never   Smokeless tobacco: Never  Vaping Use   Vaping Use: Never used  Substance and Sexual Activity   Alcohol use: Yes    Alcohol/week: 2.0 standard drinks    Types: 2 Glasses of wine per week   Drug use: Never   Sexual activity: Yes    Birth control/protection: Condom  Other Topics Concern   Not on file  Social History Narrative   Not on file   Social Determinants of Health   Financial Resource Strain: Not on file  Food Insecurity: Not on file  Transportation Needs: Not on file  Physical Activity: Not on file  Stress: Not on file  Social Connections: Not on file  Intimate Partner Violence: Not on file    Review of Systems  All other systems reviewed and are negative.  PHYSICAL EXAMINATION:    Pulse 66   Ht '5\' 2"'$  (1.575 m)   Wt 130 lb 6.4 oz (59.1 kg)   LMP 12/08/2019   SpO2 99%   BMI 23.85 kg/m     General appearance: alert, cooperative and appears stated age  59. Night sweats She will continue with 200 mg of gabapentin qhs for now We discussed the option of low dose ERT, discussed the risks and recommendations for use. Discussed using transdermal ERT. She will let me know if she wants to try a low dose estrogen patch (information given)  2. Hot flashes We discussed the option of trying 100 mg of gabapentin in the morning to see if it helps  Over 20 minutes in total patient care.

## 2021-08-14 DIAGNOSIS — N951 Menopausal and female climacteric states: Secondary | ICD-10-CM | POA: Insufficient documentation

## 2021-09-12 NOTE — Progress Notes (Signed)
55 y.o. G80P2002 Married White or Caucasian Not Hispanic or Latino female here for annual exam.     She is s/p TLH/BS in 1/21 for pelvic pain and an adnexal mass. 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   She is on gabapentin for vasomotor symptoms, also on chinese herbs which have helped. Thinking about stopping the gabapentin.  She is still having brain fog. The Oncologist she is seeing at the [facility] isn't opposed to her being on ERT.   She does have vaginal dryness, still having dyspareunia with lubrication.   Patient's last menstrual period was 12/08/2019.          Sexually active: Yes.    The current method of family planning is status post hysterectomy.    Exercising: Yes.     Walking golfing and gym  Smoker:  no  Health Maintenance: Pap:  08/13/19 Neg:Neg HR HPV  History of abnormal Pap:  no MMG:  02/08/21 Bi-rads 3 benign ([facility]) BMD:   07/23/19 Colonoscopy: 2014 WNL done in [place], f/u 10years TDaP:  11/03/15 Gardasil: n/a   reports that she has never smoked. She has never used smokeless tobacco. She reports current alcohol use of about 2.0 standard drinks per week. She reports that she does not use drugs. Moved here from [place] 4 years ago for husbands jobs. She works as a Writer. Daughters are 24 and 9, both went to [employer], both in the [employer]. Her oldest daughter is in Science writer.    Past Medical History:  Diagnosis Date   Adverse effect of anesthetic    "I have panic attacks when I come out of anesthesia."    Chronic back pain    Herniated lumbar intervertebral disc    History of cyst of breast    History of ovarian cyst    History of recurrent UTIs    Migraine with aura    Nerve pain    PONV (postoperative nausea and vomiting)    Raynaud disease    Ruptured lumbar intervertebral disc    3    Sciatica     Past Surgical History:  Procedure Laterality  Date   CHOLECYSTECTOMY     2009   CYSTOSCOPY N/A 12/15/2019   Procedure: CYSTOSCOPY;  Surgeon: Salvadore Dom, MD;  Location: [facility];  Service: Gynecology;  Laterality: N/A;   KNEE SURGERY     right knee, 2011   TONSILLECTOMY     TOTAL LAPAROSCOPIC HYSTERECTOMY WITH SALPINGECTOMY Bilateral 12/15/2019   Procedure: TOTAL LAPAROSCOPIC HYSTERECTOMY WITH BILATERAL SALPINGECTOMY and REMOVAL OF RIGHT ADNEXAL MASS;  Surgeon: Salvadore Dom, MD;  Location: [facility];  Service: Gynecology;  Laterality: Bilateral;    Current Outpatient Medications  Medication Sig Dispense Refill   acetaminophen (TYLENOL) 500 MG tablet Take 2 tablets (1,000 mg total) by mouth every 6 (six) hours. 30 tablet 0   gabapentin (NEURONTIN) 100 MG capsule Take one tablet po qhs, may increase to 2 tablets po qhs after 3 days if tolerating, may increase to 3 tablets po qhs after another 3 days if tolerating. 90 capsule 1   ibuprofen (ADVIL) 800 MG tablet Take 1 tablet (800 mg total) by mouth every 6 (six) hours. 30 tablet 0   Multiple Vitamin (MULTIVITAMIN WITH MINERALS) TABS tablet Take 1 tablet by mouth daily.     UNABLE TO FIND Med Name:  Chinese herbal  supplement Gentanae tea pill     UNABLE TO FIND Med Name: Chinese supplement eight flavor Rehmanni     nitrofurantoin (MACRODANTIN) 50 MG capsule 1 tablet po prn intercourse 30 capsule 2   No current facility-administered medications for this visit.    Family History  Problem Relation Age of Onset   Osteoporosis Mother    Stroke Mother    Migraines Mother    Myelodysplastic syndrome Father    Heart disease Father    Bladder Cancer Paternal Uncle    Stroke Maternal Grandmother    Stroke Paternal Grandmother    Lung cancer Maternal Grandfather    Coronary artery disease Brother 55       MI, needed CABG  Patient had a negative cardiac CT.   Review of Systems  All other systems reviewed and are negative.  Exam:   BP  110/72   Pulse (!) 58   Ht 5\' 2"  (1.575 m)   Wt 132 lb (59.9 kg)   LMP 12/08/2019   SpO2 99%   BMI 24.14 kg/m   Weight change: @WEIGHTCHANGE @ Height:   Height: 5\' 2"  (157.5 cm)  Ht Readings from Last 3 Encounters:  09/13/21 5\' 2"  (1.575 m)  07/05/21 5\' 2"  (1.575 m)  06/06/21 5\' 2"  (1.575 m)    General appearance: alert, cooperative and appears stated age Head: Normocephalic, without obvious abnormality, atraumatic Neck: no adenopathy, supple, symmetrical, trachea midline and thyroid normal to inspection and palpation Lungs: clear to auscultation bilaterally Cardiovascular: regular rate and rhythm Breasts: normal appearance, no masses or tenderness Abdomen: soft, non-tender; non distended,  no masses,  no organomegaly Extremities: extremities normal, atraumatic, no cyanosis or edema Skin: Skin color, texture, turgor normal. No rashes or lesions Lymph nodes: Cervical, supraclavicular, and axillary nodes normal. No abnormal inguinal nodes palpated Neurologic: Grossly normal   Pelvic: External genitalia:  no lesions              Urethra:  normal appearing urethra with no masses, tenderness or lesions              Bartholins and Skenes: normal                 Vagina: mildly atrophic appearing vagina with normal color and discharge, no lesions              Cervix: absent               Bimanual Exam:  Uterus:  uterus absent              Adnexa: no mass, fullness, tenderness               Rectovaginal: Confirms               Anus:  normal sphincter tone, no lesions    1. Well woman exam Discussed breast self exam Discussed calcium and vit D intake Mammogram UTD Colonoscopy UTD [facility] at [facility]  2. Recurrent UTI Takes antibiotics prophylacticly with intercourse.  - nitrofurantoin (MACRODANTIN) 50 MG capsule; 1 tablet po prn intercourse  Dispense: 30 capsule; Refill: 2  3. Night sweats On herbal supplements and gabapentin - gabapentin (NEURONTIN) 300 MG capsule; Take 1 capsule  (300 mg total) by mouth at bedtime.  Dispense: 90 capsule; Refill: 3  4. Vaginal atrophy Will try vaginal lubrication and vaginal estrogen - Estradiol 10 MCG TABS vaginal tablet; Place one tablet vaginally qhs x 7 days, then change to 2 x a week at hs  Dispense: 24 tablet; Refill: 3

## 2021-09-13 ENCOUNTER — Encounter: Payer: Self-pay | Admitting: Obstetrics and Gynecology

## 2021-09-13 ENCOUNTER — Ambulatory Visit (INDEPENDENT_AMBULATORY_CARE_PROVIDER_SITE_OTHER): Payer: 59 | Admitting: Obstetrics and Gynecology

## 2021-09-13 ENCOUNTER — Other Ambulatory Visit: Payer: Self-pay

## 2021-09-13 VITALS — BP 110/72 | HR 58 | Ht 62.0 in | Wt 132.0 lb

## 2021-09-13 DIAGNOSIS — R61 Generalized hyperhidrosis: Secondary | ICD-10-CM

## 2021-09-13 DIAGNOSIS — N952 Postmenopausal atrophic vaginitis: Secondary | ICD-10-CM | POA: Diagnosis not present

## 2021-09-13 DIAGNOSIS — N390 Urinary tract infection, site not specified: Secondary | ICD-10-CM

## 2021-09-13 DIAGNOSIS — Z01419 Encounter for gynecological examination (general) (routine) without abnormal findings: Secondary | ICD-10-CM

## 2021-09-13 MED ORDER — ESTRADIOL 10 MCG VA TABS: ORAL_TABLET | VAGINAL | 3 refills | Status: DC

## 2021-09-13 MED ORDER — NITROFURANTOIN MACROCRYSTAL 50 MG PO CAPS: ORAL_CAPSULE | ORAL | 2 refills | Status: DC

## 2021-09-13 MED ORDER — GABAPENTIN 300 MG PO CAPS: 300.0000 mg | ORAL_CAPSULE | Freq: Every day | ORAL | 3 refills | Status: DC

## 2021-09-13 NOTE — Patient Instructions (Signed)

## 2021-12-13 ENCOUNTER — Encounter: Payer: Self-pay | Admitting: Family Medicine

## 2021-12-13 ENCOUNTER — Ambulatory Visit: Payer: 59 | Admitting: Family Medicine

## 2021-12-13 DIAGNOSIS — M7662 Achilles tendinitis, left leg: Secondary | ICD-10-CM

## 2021-12-13 DIAGNOSIS — M7661 Achilles tendinitis, right leg: Secondary | ICD-10-CM | POA: Diagnosis not present

## 2021-12-13 NOTE — Progress Notes (Signed)
PCP: Brent General, MD  Subjective:   HPI: Patient is a 56 y.o. female here for bilateral heel pain.  Patient reports she's had about 5 years of posterior heel pain. She did physical therapy for 4 months and home exercises. A podiatrist did injections as well. Tried motrin, massage and currently doing acupuncture. Tried different shoes as well including hokas, currently in [employer].  Past Medical History:  Diagnosis Date   Adverse effect of anesthetic    "I have panic attacks when I come out of anesthesia."    Chronic back pain    Herniated lumbar intervertebral disc    History of cyst of breast    History of ovarian cyst    History of recurrent UTIs    Migraine with aura    Nerve pain    PONV (postoperative nausea and vomiting)    Raynaud disease    Ruptured lumbar intervertebral disc    3    Sciatica     Current Outpatient Medications on File Prior to Visit  Medication Sig Dispense Refill   acetaminophen (TYLENOL) 500 MG tablet Take 2 tablets (1,000 mg total) by mouth every 6 (six) hours. 30 tablet 0   Estradiol 10 MCG TABS vaginal tablet Place one tablet vaginally qhs x 7 days, then change to 2 x a week at hs 24 tablet 3   gabapentin (NEURONTIN) 300 MG capsule Take 1 capsule (300 mg total) by mouth at bedtime. 90 capsule 3   ibuprofen (ADVIL) 800 MG tablet Take 1 tablet (800 mg total) by mouth every 6 (six) hours. 30 tablet 0   Multiple Vitamin (MULTIVITAMIN WITH MINERALS) TABS tablet Take 1 tablet by mouth daily.     nitrofurantoin (MACRODANTIN) 50 MG capsule 1 tablet po prn intercourse 30 capsule 2   UNABLE TO FIND Med Name:  Chinese herbal supplement Gentanae tea pill     UNABLE TO FIND Med Name: Chinese supplement eight flavor Rehmanni     No current facility-administered medications on file prior to visit.    Past Surgical History:  Procedure Laterality Date   CHOLECYSTECTOMY     2009   CYSTOSCOPY N/A 12/15/2019   Procedure: CYSTOSCOPY;  Surgeon: Salvadore Dom, MD;  Location: [facility];  Service: Gynecology;  Laterality: N/A;   KNEE SURGERY     right knee, 2011   TONSILLECTOMY     TOTAL LAPAROSCOPIC HYSTERECTOMY WITH SALPINGECTOMY Bilateral 12/15/2019   Procedure: TOTAL LAPAROSCOPIC HYSTERECTOMY WITH BILATERAL SALPINGECTOMY and REMOVAL OF RIGHT ADNEXAL MASS;  Surgeon: Salvadore Dom, MD;  Location: [place];  Service: Gynecology;  Laterality: Bilateral;    Allergies  Allergen Reactions   Ciprofloxacin     Achilles pain, does not wish to take   Milk-Related Compounds Diarrhea   Prednisone     Makes blood pressure drop per patient    BP 110/80    Ht 5\' 2"  (1.575 m)    Wt 132 lb (59.9 kg)    LMP 12/08/2019    BMI 24.14 kg/m   [facility] Adult Exercise 12/13/2021  Frequency of aerobic exercise (# of days/week) 0  Average time in minutes 0  Frequency of strengthening activities (# of days/week) 0    No flowsheet data found.      Objective:  Physical Exam:  Gen: NAD, comfortable in exam room  Bilateral feet/ankles: Nodule within achilles approximately 2 cm proximal to insertion on calcaneus - larger on right than left.  Pes  cavus more on right than left also.  No other gross deformity, swelling, ecchymoses FROM with normal strength TTP in nodular areas noted above.  No other tenderness Negative ant drawer and negative talar tilt.   Negative syndesmotic compression. Negative calcaneal squeeze. Thompsons test negative. NV intact distally.   Fabers with notable stiffness on right compared to left. Leg lengths both 82cm Hip abduction strength 4/5 bilaterally Gait with mild outturning on stride bilaterally.  Assessment & Plan:  1. Bilateral achilles tendinopathy - reviewed home exercises and stretches to do daily.  Heel lifts or shoes with natural heel lift.  Icing.  Has done physical therapy. Continue with acupuncture.  Has history of migraines so will wait on nitro.   She's interested in starting shockwave therapy so started this today.  SI joint stretches and hip abduction strengthening exercises given also.  Procedure: ECSWT Indications:  bilateral achilles tendinopathy   Procedure Details Consent: Risks of procedure as well as the alternatives and risks of each were explained to the patient.  Written consent for procedure obtained. Time Out: Verified patient identification, verified procedure, site was marked, verified correct patient position, medications/allergies/relevent history reviewed.  The area was cleaned with alcohol swab.     The bilateral achilles tendons was targeted for Extracorporeal shockwave therapy.    Preset: achillodynia Power Level: 70 Frequency: 7 Impulse/cycles: 2000 bilaterally Head size: large   Patient tolerated procedure well without immediate complications

## 2021-12-13 NOTE — Patient Instructions (Signed)
You have Achilles Tendinopathy Do home exercises and stretches every day. Icing 15 minutes at a time 3-4 times a day as needed. Avoid uneven ground, hills as much as possible. Heel lifts in shoes or shoes with a natural heel lift are very important. Start shockwave therapy today - follow up in 1 week for second session - usually 3-4 sessions are required for benefit. Do SI joint stretches especially on that right side and hip abduction strengthening both sides. Consider orthotics in the future but these are less important than the heel lift.

## 2021-12-25 ENCOUNTER — Encounter: Payer: Self-pay | Admitting: Family Medicine

## 2021-12-25 ENCOUNTER — Ambulatory Visit (INDEPENDENT_AMBULATORY_CARE_PROVIDER_SITE_OTHER): Payer: Self-pay | Admitting: Family Medicine

## 2021-12-25 DIAGNOSIS — M7662 Achilles tendinitis, left leg: Secondary | ICD-10-CM

## 2021-12-25 DIAGNOSIS — M7661 Achilles tendinitis, right leg: Secondary | ICD-10-CM

## 2021-12-25 NOTE — Progress Notes (Signed)
PCP: Brent General, MD  Subjective:   HPI: Patient is a 56 y.o. female here for second ecswt treatment for achilles tendinopathy.  She is overall feeling better compared to 2 weeks ago. Doing acupuncture, manual therapy as well.  Past Medical History:  Diagnosis Date   Adverse effect of anesthetic    "I have panic attacks when I come out of anesthesia."    Chronic back pain    Herniated lumbar intervertebral disc    History of cyst of breast    History of ovarian cyst    History of recurrent UTIs    Migraine with aura    Nerve pain    PONV (postoperative nausea and vomiting)    Raynaud disease    Ruptured lumbar intervertebral disc    3    Sciatica     Current Outpatient Medications on File Prior to Visit  Medication Sig Dispense Refill   acetaminophen (TYLENOL) 500 MG tablet Take 2 tablets (1,000 mg total) by mouth every 6 (six) hours. 30 tablet 0   Estradiol 10 MCG TABS vaginal tablet Place one tablet vaginally qhs x 7 days, then change to 2 x a week at hs 24 tablet 3   gabapentin (NEURONTIN) 300 MG capsule Take 1 capsule (300 mg total) by mouth at bedtime. 90 capsule 3   ibuprofen (ADVIL) 800 MG tablet Take 1 tablet (800 mg total) by mouth every 6 (six) hours. 30 tablet 0   Multiple Vitamin (MULTIVITAMIN WITH MINERALS) TABS tablet Take 1 tablet by mouth daily.     nitrofurantoin (MACRODANTIN) 50 MG capsule 1 tablet po prn intercourse 30 capsule 2   UNABLE TO FIND Med Name:  Chinese herbal supplement Gentanae tea pill     UNABLE TO FIND Med Name: Chinese supplement eight flavor Rehmanni     No current facility-administered medications on file prior to visit.    Past Surgical History:  Procedure Laterality Date   CHOLECYSTECTOMY     2009   CYSTOSCOPY N/A 12/15/2019   Procedure: CYSTOSCOPY;  Surgeon: Salvadore Dom, MD;  Location: [facility];  Service: Gynecology;  Laterality: N/A;   KNEE SURGERY     right knee, 2011   TONSILLECTOMY     TOTAL  LAPAROSCOPIC HYSTERECTOMY WITH SALPINGECTOMY Bilateral 12/15/2019   Procedure: TOTAL LAPAROSCOPIC HYSTERECTOMY WITH BILATERAL SALPINGECTOMY and REMOVAL OF RIGHT ADNEXAL MASS;  Surgeon: Salvadore Dom, MD;  Location: [place];  Service: Gynecology;  Laterality: Bilateral;    Allergies  Allergen Reactions   Ciprofloxacin     Achilles pain, does not wish to take   Milk-Related Compounds Diarrhea   Prednisone     Makes blood pressure drop per patient    LMP 12/08/2019   [place] Adult Exercise 12/13/2021  Frequency of aerobic exercise (# of days/week) 0  Average time in minutes 0  Frequency of strengthening activities (# of days/week) 0    No flowsheet data found.      Objective:  Physical Exam:  Gen: NAD, comfortable in exam room  Nodule right achilles about 2-3 cm proximal to insertion.  No nodule on left.  Both with very mild tenderness - within nodule on right, just proximal to insertion on left.   Assessment & Plan:  1. Bilateral achilles tendinopathy - second shockwave treatment today.  Continue with other measures discussed at initial visit.  Procedure: ECSWT Indications:  bilateral achilles tendinopathy   Procedure Details Consent: Risks of procedure as well as  the alternatives and risks of each were explained to the patient.  Written consent for procedure obtained. Time Out: Verified patient identification, verified procedure, site was marked, verified correct patient position, medications/allergies/relevent history reviewed.  The area was cleaned with alcohol swab.     Bilateral achilles tendons were targeted for Extracorporeal shockwave therapy.    Preset: achillodynia Power Level: 80 Frequency: 8 Impulse/cycles: 2000 bilaterally Head size: large   Patient tolerated procedure well without immediate complications

## 2022-01-01 ENCOUNTER — Encounter: Payer: Self-pay | Admitting: Family Medicine

## 2022-01-01 ENCOUNTER — Other Ambulatory Visit: Payer: Self-pay

## 2022-01-01 ENCOUNTER — Ambulatory Visit (INDEPENDENT_AMBULATORY_CARE_PROVIDER_SITE_OTHER): Payer: Self-pay | Admitting: Family Medicine

## 2022-01-01 DIAGNOSIS — M7662 Achilles tendinitis, left leg: Secondary | ICD-10-CM

## 2022-01-01 DIAGNOSIS — M7661 Achilles tendinitis, right leg: Secondary | ICD-10-CM

## 2022-01-01 NOTE — Progress Notes (Signed)
PCP: Brent General, MD  Subjective:   HPI: Patient is a 56 y.o. female here for bilateral achilles tendinopathy.  Patient here for third shockwave treatment for bilateral achilles tendinopathy. She is improving, overall doing well.  Past Medical History:  Diagnosis Date   Adverse effect of anesthetic    "I have panic attacks when I come out of anesthesia."    Chronic back pain    Herniated lumbar intervertebral disc    History of cyst of breast    History of ovarian cyst    History of recurrent UTIs    Migraine with aura    Nerve pain    PONV (postoperative nausea and vomiting)    Raynaud disease    Ruptured lumbar intervertebral disc    3    Sciatica     Current Outpatient Medications on File Prior to Visit  Medication Sig Dispense Refill   acetaminophen (TYLENOL) 500 MG tablet Take 2 tablets (1,000 mg total) by mouth every 6 (six) hours. 30 tablet 0   Estradiol 10 MCG TABS vaginal tablet Place one tablet vaginally qhs x 7 days, then change to 2 x a week at hs 24 tablet 3   gabapentin (NEURONTIN) 300 MG capsule Take 1 capsule (300 mg total) by mouth at bedtime. 90 capsule 3   ibuprofen (ADVIL) 800 MG tablet Take 1 tablet (800 mg total) by mouth every 6 (six) hours. 30 tablet 0   Multiple Vitamin (MULTIVITAMIN WITH MINERALS) TABS tablet Take 1 tablet by mouth daily.     nitrofurantoin (MACRODANTIN) 50 MG capsule 1 tablet po prn intercourse 30 capsule 2   UNABLE TO FIND Med Name:  Chinese herbal supplement Gentanae tea pill     UNABLE TO FIND Med Name: Chinese supplement eight flavor Rehmanni     No current facility-administered medications on file prior to visit.    Past Surgical History:  Procedure Laterality Date   CHOLECYSTECTOMY     2009   CYSTOSCOPY N/A 12/15/2019   Procedure: CYSTOSCOPY;  Surgeon: Salvadore Dom, MD;  Location: [facility];  Service: Gynecology;  Laterality: N/A;   KNEE SURGERY     right knee, 2011   TONSILLECTOMY     TOTAL  LAPAROSCOPIC HYSTERECTOMY WITH SALPINGECTOMY Bilateral 12/15/2019   Procedure: TOTAL LAPAROSCOPIC HYSTERECTOMY WITH BILATERAL SALPINGECTOMY and REMOVAL OF RIGHT ADNEXAL MASS;  Surgeon: Salvadore Dom, MD;  Location: [place];  Service: Gynecology;  Laterality: Bilateral;    Allergies  Allergen Reactions   Ciprofloxacin     Achilles pain, does not wish to take   Milk-Related Compounds Diarrhea   Prednisone     Makes blood pressure drop per patient    LMP 12/08/2019   [place] Adult Exercise 12/13/2021  Frequency of aerobic exercise (# of days/week) 0  Average time in minutes 0  Frequency of strengthening activities (# of days/week) 0    No flowsheet data found.      Objective:  Physical Exam:  Gen: NAD, comfortable in exam room  Bilateral foot/ankle exam not repeated today.   Assessment & Plan:  1. Bilateral achilles tendinopathy - third shockwave treatment performed today.  F/u in 1 week for fourth and final treatment.  Procedure: ECSWT Indications:  bilateral achilles tendinopathy   Procedure Details Consent: Risks of procedure as well as the alternatives and risks of each were explained to the patient.  Written consent for procedure obtained. Time Out: Verified patient identification, verified procedure, site was  marked, verified correct patient position, medications/allergies/relevent history reviewed.  The area was cleaned with alcohol swab.     Bilateral achilles tendons were targeted for Extracorporeal shockwave therapy.    Preset: achillodynia Power Level: 90 Frequency: 9 Impulse/cycles: 2000 bilaterally Head size: large   Patient tolerated procedure well without immediate complications

## 2022-01-10 ENCOUNTER — Encounter: Payer: Self-pay | Admitting: Family Medicine

## 2022-01-10 ENCOUNTER — Ambulatory Visit (INDEPENDENT_AMBULATORY_CARE_PROVIDER_SITE_OTHER): Payer: Self-pay | Admitting: Family Medicine

## 2022-01-10 VITALS — Ht 62.0 in

## 2022-01-10 DIAGNOSIS — M7661 Achilles tendinitis, right leg: Secondary | ICD-10-CM

## 2022-01-10 DIAGNOSIS — M7662 Achilles tendinitis, left leg: Secondary | ICD-10-CM

## 2022-01-10 NOTE — Progress Notes (Signed)
PCP: Brent General, MD  Subjective:   HPI: Patient is a 56 y.o. female here for bilateral achilles tendinopathy.  Patient returns for fourth shockwave treatment. Overall has had mild improvement.  Past Medical History:  Diagnosis Date   Adverse effect of anesthetic    "I have panic attacks when I come out of anesthesia."    Chronic back pain    Herniated lumbar intervertebral disc    History of cyst of breast    History of ovarian cyst    History of recurrent UTIs    Migraine with aura    Nerve pain    PONV (postoperative nausea and vomiting)    Raynaud disease    Ruptured lumbar intervertebral disc    3    Sciatica     Current Outpatient Medications on File Prior to Visit  Medication Sig Dispense Refill   acetaminophen (TYLENOL) 500 MG tablet Take 2 tablets (1,000 mg total) by mouth every 6 (six) hours. 30 tablet 0   Estradiol 10 MCG TABS vaginal tablet Place one tablet vaginally qhs x 7 days, then change to 2 x a week at hs 24 tablet 3   gabapentin (NEURONTIN) 300 MG capsule Take 1 capsule (300 mg total) by mouth at bedtime. 90 capsule 3   ibuprofen (ADVIL) 800 MG tablet Take 1 tablet (800 mg total) by mouth every 6 (six) hours. 30 tablet 0   Multiple Vitamin (MULTIVITAMIN WITH MINERALS) TABS tablet Take 1 tablet by mouth daily.     nitrofurantoin (MACRODANTIN) 50 MG capsule 1 tablet po prn intercourse 30 capsule 2   UNABLE TO FIND Med Name:  Chinese herbal supplement Gentanae tea pill     UNABLE TO FIND Med Name: Chinese supplement eight flavor Rehmanni     No current facility-administered medications on file prior to visit.    Past Surgical History:  Procedure Laterality Date   CHOLECYSTECTOMY     2009   CYSTOSCOPY N/A 12/15/2019   Procedure: CYSTOSCOPY;  Surgeon: Salvadore Dom, MD;  Location: [facility];  Service: Gynecology;  Laterality: N/A;   KNEE SURGERY     right knee, 2011   TONSILLECTOMY     TOTAL LAPAROSCOPIC HYSTERECTOMY WITH  SALPINGECTOMY Bilateral 12/15/2019   Procedure: TOTAL LAPAROSCOPIC HYSTERECTOMY WITH BILATERAL SALPINGECTOMY and REMOVAL OF RIGHT ADNEXAL MASS;  Surgeon: Salvadore Dom, MD;  Location: [place];  Service: Gynecology;  Laterality: Bilateral;    Allergies  Allergen Reactions   Ciprofloxacin     Achilles pain, does not wish to take   Milk-Related Compounds Diarrhea   Prednisone     Makes blood pressure drop per patient    Ht 5\' 2"  (1.575 m)    LMP 12/08/2019    BMI 24.14 kg/m   [place] Adult Exercise 12/13/2021  Frequency of aerobic exercise (# of days/week) 0  Average time in minutes 0  Frequency of strengthening activities (# of days/week) 0    No flowsheet data found.      Objective:  Physical Exam:  Gen: NAD, comfortable in exam room   Assessment & Plan:  1. Bilateral achilles tendinopathy - continue with home exercises and stretches.  Fourth shockwave treatment given today.  Will see how she does over next 4 weeks.  Consider PRP if not improving.  Procedure: ECSWT Indications:  bilateral achilles tendinopathy   Procedure Details Consent: Risks of procedure as well as the alternatives and risks of each were explained to the patient.  Written consent for procedure obtained. Time Out: Verified patient identification, verified procedure, site was marked, verified correct patient position, medications/allergies/relevent history reviewed.  The area was cleaned with alcohol swab.     Bilateral achilles tendons were targeted for Extracorporeal shockwave therapy.    Preset: achillodynia Power Level: 90 Frequency: 10 Impulse/cycles: 2000 bilaterally Head size: large   Patient tolerated procedure well without immediate complications

## 2022-03-29 ENCOUNTER — Encounter: Payer: Self-pay | Admitting: Obstetrics and Gynecology

## 2022-03-29 ENCOUNTER — Other Ambulatory Visit: Payer: Self-pay | Admitting: Obstetrics and Gynecology

## 2022-03-29 DIAGNOSIS — R232 Flushing: Secondary | ICD-10-CM

## 2022-03-29 DIAGNOSIS — R61 Generalized hyperhidrosis: Secondary | ICD-10-CM

## 2022-03-29 MED ORDER — ESTRADIOL 0.025 MG/24HR TD PTTW: 1.0000 | MEDICATED_PATCH | TRANSDERMAL | 1 refills | Status: DC

## 2022-03-29 NOTE — Progress Notes (Signed)
See my chart note.

## 2022-04-24 DIAGNOSIS — E8941 Symptomatic postprocedural ovarian failure: Secondary | ICD-10-CM | POA: Insufficient documentation

## 2022-04-25 ENCOUNTER — Encounter: Payer: Self-pay | Admitting: Obstetrics and Gynecology

## 2022-04-25 ENCOUNTER — Ambulatory Visit: Payer: 59 | Admitting: Obstetrics and Gynecology

## 2022-04-25 VITALS — BP 118/64 | HR 67 | Wt 135.0 lb

## 2022-04-25 DIAGNOSIS — Z79890 Hormone replacement therapy (postmenopausal): Secondary | ICD-10-CM

## 2022-04-25 DIAGNOSIS — Z7189 Other specified counseling: Secondary | ICD-10-CM

## 2022-04-25 MED ORDER — ESTRADIOL 0.025 MG/24HR TD PTTW: 1.0000 | MEDICATED_PATCH | TRANSDERMAL | 1 refills | Status: DC

## 2022-04-25 NOTE — Progress Notes (Signed)
GYNECOLOGY  VISIT   HPI: 56 y.o.   Married White or Caucasian Not Hispanic or Latino  female   [phone] with Patient's last menstrual period was 12/08/2019.   here for f/u on HRT. She was started on a low dose ERT patch at the end of April. Night sweats are much better with the patch. She is still waking up early, can be a little sweat. She is having mild hot flashes 3-4 x a day.   She has had been sick since 04/07/22 with sinus issues, bronchitis, headaches. Thinks she is having allergies.  Wants to see how she feels over the next few weeks, will reach out if she wants to increase her dose.   She has been followed for mammogram changes, biopsy was benign. She was seen by Oncology and they felt HRT was reasonable given the low overal increased risk of breast cancer and her severe vasomotor symptoms.   GYNECOLOGIC HISTORY: Patient's last menstrual period was 12/08/2019. Contraception:hysterectomy Menopausal hormone therapy: ERT        OB History     Gravida  2   Para  2   Term  2   Preterm      AB      Living  2      SAB      IAB      Ectopic      Multiple      Live Births  2              Patient Active Problem List   Diagnosis Date Noted   Vaginal dryness, menopausal 08/14/2021   Abnormal mammogram 09/13/2020   Dilatation of thoracic aorta ([place]) 09/12/2020   Status post laparoscopic hysterectomy 12/15/2019   Chronic back pain    Lumbar radiculopathy 09/12/2018   Enlarged lymph node in neck 04/03/2016   Anemia 11/24/2015   Ocular migraine 08/12/2013   Migraine 12/03/1998   Raynaud disease 12/03/1978    Past Medical History:  Diagnosis Date   Adverse effect of anesthetic    "I have panic attacks when I come out of anesthesia."    Chronic back pain    Herniated lumbar intervertebral disc    History of cyst of breast    History of ovarian cyst    History of recurrent UTIs    Migraine with aura    Nerve pain    PONV (postoperative nausea and  vomiting)    Raynaud disease    Ruptured lumbar intervertebral disc    3    Sciatica     Past Surgical History:  Procedure Laterality Date   CHOLECYSTECTOMY     2009   CYSTOSCOPY N/A 12/15/2019   Procedure: CYSTOSCOPY;  Surgeon: Salvadore Dom, MD;  Location: [place];  Service: Gynecology;  Laterality: N/A;   KNEE SURGERY     right knee, 2011   TONSILLECTOMY     TOTAL LAPAROSCOPIC HYSTERECTOMY WITH SALPINGECTOMY Bilateral 12/15/2019   Procedure: TOTAL LAPAROSCOPIC HYSTERECTOMY WITH BILATERAL SALPINGECTOMY and REMOVAL OF RIGHT ADNEXAL MASS;  Surgeon: Salvadore Dom, MD;  Location: [place];  Service: Gynecology;  Laterality: Bilateral;    Current Outpatient Medications  Medication Sig Dispense Refill   acetaminophen (TYLENOL) 500 MG tablet Take 2 tablets (1,000 mg total) by mouth every 6 (six) hours. 30 tablet 0   estradiol (VIVELLE-DOT) 0.025 MG/24HR Place 1 patch onto the skin 2 (two) times a week. 8 patch 1   Estradiol 10 MCG TABS  vaginal tablet Place one tablet vaginally qhs x 7 days, then change to 2 x a week at hs 24 tablet 3   gabapentin (NEURONTIN) 300 MG capsule Take 1 capsule (300 mg total) by mouth at bedtime. 90 capsule 3   ibuprofen (ADVIL) 800 MG tablet Take 1 tablet (800 mg total) by mouth every 6 (six) hours. 30 tablet 0   Multiple Vitamin (MULTIVITAMIN WITH MINERALS) TABS tablet Take 1 tablet by mouth daily.     nitrofurantoin (MACRODANTIN) 50 MG capsule 1 tablet po prn intercourse 30 capsule 2   UNABLE TO FIND Med Name:  Chinese herbal supplement Gentanae tea pill     UNABLE TO FIND Med Name: Chinese supplement eight flavor Rehmanni     No current facility-administered medications for this visit.     ALLERGIES: Ciprofloxacin, Milk-related compounds, and Prednisone  Family History  Problem Relation Age of Onset   Osteoporosis Mother    Stroke Mother    Migraines Mother    Myelodysplastic syndrome Father    Heart  disease Father    Bladder Cancer Paternal Uncle    Stroke Maternal Grandmother    Stroke Paternal Grandmother    Lung cancer Maternal Grandfather    Coronary artery disease Brother 52       MI, needed CABG    Social History   Socioeconomic History   Marital status: Married    Spouse name: Not on file   Number of children: Not on file   Years of education: Not on file   Highest education level: Not on file  Occupational History   Not on file  Tobacco Use   Smoking status: Never   Smokeless tobacco: Never  Vaping Use   Vaping Use: Never used  Substance and Sexual Activity   Alcohol use: Yes    Alcohol/week: 2.0 standard drinks    Types: 2 Glasses of wine per week   Drug use: Never   Sexual activity: Yes    Birth control/protection: Condom  Other Topics Concern   Not on file  Social History Narrative   Not on file   Social Determinants of Health   Financial Resource Strain: Not on file  Food Insecurity: Not on file  Transportation Needs: Not on file  Physical Activity: Not on file  Stress: Not on file  Social Connections: Not on file  Intimate Partner Violence: Not on file    Review of Systems  All other systems reviewed and are negative.  PHYSICAL EXAMINATION:    LMP 12/08/2019     General appearance: alert, cooperative and appears stated age  14. Counseling for estrogen replacement therapy She is doing better on low dose ERT, will call if she feels she needs to increase the dose. We discussed the risks of ERT in detail.  - estradiol (VIVELLE-DOT) 0.025 MG/24HR; Place 1 patch onto the skin 2 (two) times a week.  Dispense: 24 patch; Refill: 1

## 2022-05-07 ENCOUNTER — Encounter: Payer: Self-pay | Admitting: Obstetrics and Gynecology

## 2022-05-07 NOTE — Telephone Encounter (Signed)
Please let the patient know that it is not typical to have a response like she is to the patch. Is she having a local reaction to the patch? I would expect if she was having an allergic response there would be some local effect. Are her symptoms getting worse with subsequent applications? Any SOB or swelling of her lips or tongue? If so she shouldn't use it and should see an Allergist.   If not, we could the divigel, 0.25 mg applied topically daily. Or the 0.5 mg oral estradiol. I doubt that she is allergic to estradiol, but she could be reacting to something else with the patch.

## 2022-05-15 MED ORDER — ESTRADIOL 0.25 MG/0.25GM TD GEL: TRANSDERMAL | 1 refills | Status: DC

## 2022-08-22 DIAGNOSIS — K146 Glossodynia: Secondary | ICD-10-CM | POA: Insufficient documentation

## 2022-09-17 DIAGNOSIS — Z9889 Other specified postprocedural states: Secondary | ICD-10-CM | POA: Insufficient documentation

## 2022-09-19 ENCOUNTER — Ambulatory Visit: Payer: 59 | Admitting: Obstetrics and Gynecology

## 2022-09-19 NOTE — Progress Notes (Signed)
56 y.o. G2P2002 Married White or Caucasian Not Hispanic or Latino female here for annual exam.  TLH/BS in 1/21 for pelvic pain and an adnexal mass. 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  Patient states that she needs a refill on gabapentin and nitrofurantoin.  She is taking gabapentin 300 mg po qhs which keeps her night sweats at bay. Doesn't make her sleep. She has tolerable hot flashes.  She takes nitrofurantoin prn intercourse.  Low libido. Some vaginal dryness, helped with lubrication.   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   No bowel or bladder issues.     Patient's last menstrual period was 12/08/2019.          Sexually active: Yes.    The current method of family planning is status post hysterectomy.    Exercising: Yes.     Patient states she does some  Smoker:  no  Health Maintenance: Pap:  08/13/19 Neg:Neg HR HPV  History of abnormal Pap:  no MMG:  09/17/22 Bi-rads 2 benign ( care everywhere)  BMD:   07/23/19 normal Colonoscopy:2014 WNL done in [place], f/u 10years TDaP:  11/03/15 Gardasil: n/a   reports that she has never smoked. She has never used smokeless tobacco. She reports current alcohol use of about 2.0 standard drinks of alcohol per week. She reports that she does not use drugs.  Past Medical History:  Diagnosis Date   Adverse effect of anesthetic    "I have panic attacks when I come out of anesthesia."    Chronic back pain    Herniated lumbar intervertebral disc    History of cyst of breast    History of ovarian cyst    History of recurrent UTIs    Migraine with aura    Nerve pain    PONV (postoperative nausea and vomiting)    Raynaud disease    Ruptured lumbar intervertebral disc    3    Sciatica     Past Surgical History:  Procedure Laterality Date   CHOLECYSTECTOMY      2009   CYSTOSCOPY N/A 12/15/2019   Procedure: CYSTOSCOPY;  Surgeon: Salvadore Dom, MD;  Location: [facility];  Service: Gynecology;  Laterality: N/A;   KNEE SURGERY     right knee, 2011   TONSILLECTOMY     TOTAL LAPAROSCOPIC HYSTERECTOMY WITH SALPINGECTOMY Bilateral 12/15/2019   Procedure: TOTAL LAPAROSCOPIC HYSTERECTOMY WITH BILATERAL SALPINGECTOMY and REMOVAL OF RIGHT ADNEXAL MASS;  Surgeon: Salvadore Dom, MD;  Location: [place];  Service: Gynecology;  Laterality: Bilateral;    Current Outpatient Medications  Medication Sig Dispense Refill   acetaminophen (TYLENOL) 500 MG tablet Take 2 tablets (1,000 mg total) by mouth every 6 (six) hours. 30 tablet 0   gabapentin (NEURONTIN) 300 MG capsule Take 1 capsule (300 mg total) by mouth at bedtime. 90 capsule 3   ibuprofen (ADVIL) 800 MG tablet Take 1 tablet (800 mg total) by mouth every 6 (six) hours. 30 tablet 0   Multiple Vitamin (MULTIVITAMIN WITH MINERALS) TABS tablet Take 1 tablet by mouth daily.     nitrofurantoin (MACRODANTIN) 50 MG capsule 1 tablet po prn intercourse 30 capsule 2   No current facility-administered medications for this visit.    Family History  Problem Relation Age of Onset   Osteoporosis Mother  Stroke Mother    Migraines Mother    Myelodysplastic syndrome Father    Heart disease Father    Bladder Cancer Paternal Uncle    Stroke Maternal Grandmother    Stroke Paternal Grandmother    Lung cancer Maternal Grandfather    Coronary artery disease Brother 62       MI, needed CABG    Review of Systems  All other systems reviewed and are negative.   Exam:   BP 126/70   Pulse 62   Ht 5' 1.42" (1.56 m)   Wt 135 lb (61.2 kg)   LMP 12/08/2019   SpO2 99%   BMI 25.16 kg/m   Weight change: '@WEIGHTCHANGE'$ @ Height:   Height: 5' 1.42" (156 cm)  Ht Readings from Last 3 Encounters:  09/26/22 5' 1.42" (1.56 m)  01/10/22 '5\' 2"'$  (1.575 m)  12/13/21 '5\' 2"'$  (1.575 m)     General appearance: alert, cooperative and appears stated age Head: Normocephalic, without obvious abnormality, atraumatic Neck: no adenopathy, supple, symmetrical, trachea midline and thyroid normal to inspection and palpation Lungs: clear to auscultation bilaterally Cardiovascular: regular rate and rhythm Breasts: normal appearance, no masses or tenderness Abdomen: soft, non-tender; non distended,  no masses,  no organomegaly Extremities: extremities normal, atraumatic, no cyanosis or edema Skin: Skin color, texture, turgor normal. No rashes or lesions Lymph nodes: Cervical, supraclavicular, and axillary nodes normal. No abnormal inguinal nodes palpated Neurologic: Grossly normal   Pelvic: External genitalia:  no lesions              Urethra:  normal appearing urethra with no masses, tenderness or lesions              Bartholins and Skenes: normal                 Vagina: normal appearing vagina with normal color and discharge, no lesions              Cervix: absent               Bimanual Exam:  Uterus:  uterus absent              Adnexa: no mass, fullness, tenderness               Rectovaginal: Confirms               Anus:  normal sphincter tone, no lesions  Gae Dry, CMA chaperoned for the exam.  1. Well woman exam Discussed breast self exam Discussed calcium and vit D intake Mammogram UTD Labs with Franz Dell.   2. Colon cancer screening - Ambulatory referral to Gastroenterology  3. Night sweats - gabapentin (NEURONTIN) 300 MG capsule; Take 1 capsule (300 mg total) by mouth at bedtime.  Dispense: 90 capsule; Refill: 3  4. Recurrent UTI - nitrofurantoin (MACRODANTIN) 50 MG capsule; 1 tablet po prn intercourse  Dispense: 30 capsule; Refill: 2

## 2022-09-26 ENCOUNTER — Encounter: Payer: Self-pay | Admitting: Obstetrics and Gynecology

## 2022-09-26 ENCOUNTER — Ambulatory Visit (INDEPENDENT_AMBULATORY_CARE_PROVIDER_SITE_OTHER): Payer: 59 | Admitting: Obstetrics and Gynecology

## 2022-09-26 VITALS — BP 126/70 | HR 62 | Ht 61.42 in | Wt 135.0 lb

## 2022-09-26 DIAGNOSIS — Z01419 Encounter for gynecological examination (general) (routine) without abnormal findings: Secondary | ICD-10-CM

## 2022-09-26 DIAGNOSIS — R61 Generalized hyperhidrosis: Secondary | ICD-10-CM | POA: Diagnosis not present

## 2022-09-26 DIAGNOSIS — N390 Urinary tract infection, site not specified: Secondary | ICD-10-CM

## 2022-09-26 DIAGNOSIS — Z1211 Encounter for screening for malignant neoplasm of colon: Secondary | ICD-10-CM

## 2022-09-26 MED ORDER — NITROFURANTOIN MACROCRYSTAL 50 MG PO CAPS: ORAL_CAPSULE | ORAL | 2 refills | Status: DC

## 2022-09-26 MED ORDER — GABAPENTIN 300 MG PO CAPS: 300.0000 mg | ORAL_CAPSULE | Freq: Every day | ORAL | 3 refills | Status: DC

## 2022-09-26 NOTE — Patient Instructions (Signed)

## 2022-10-29 ENCOUNTER — Telehealth: Payer: Self-pay | Admitting: *Deleted

## 2022-10-29 ENCOUNTER — Ambulatory Visit (AMBULATORY_SURGERY_CENTER): Payer: Self-pay | Admitting: *Deleted

## 2022-10-29 ENCOUNTER — Other Ambulatory Visit: Payer: Self-pay

## 2022-10-29 VITALS — Ht 61.5 in | Wt 135.0 lb

## 2022-10-29 DIAGNOSIS — Z1211 Encounter for screening for malignant neoplasm of colon: Secondary | ICD-10-CM

## 2022-10-29 MED ORDER — NA SULFATE-K SULFATE-MG SULF 17.5-3.13-1.6 GM/177ML PO SOLN: 1.0000 | Freq: Once | ORAL | 0 refills | Status: AC

## 2022-10-29 NOTE — Telephone Encounter (Signed)
Susan Leonard, patient had PV today, she was added after you did this day's schedule. Thanks

## 2022-10-29 NOTE — Progress Notes (Signed)
Pre visit conducted over telephone.  Instructions forwarded through [place].  No egg or soy allergy known to patient  No issues known to pt with past sedation with any surgeries or procedures Patient denies ever being told they had issues or difficulty with intubation  No FH of Malignant Hyperthermia Pt is not on diet pills Pt is not on  home 02  Pt is not on blood thinners  Pt denies issues with constipation  No A fib or A flutter  Pt instructed to use [email or web address] or GoodRx for a price reduction on prep

## 2022-10-30 ENCOUNTER — Encounter: Payer: Self-pay | Admitting: Gastroenterology

## 2022-11-06 ENCOUNTER — Ambulatory Visit (AMBULATORY_SURGERY_CENTER): Payer: 59 | Admitting: Gastroenterology

## 2022-11-06 ENCOUNTER — Encounter: Payer: Self-pay | Admitting: Gastroenterology

## 2022-11-06 VITALS — BP 124/60 | HR 55 | Temp 97.7°F | Resp 11 | Ht 61.0 in | Wt 135.0 lb

## 2022-11-06 DIAGNOSIS — Z1211 Encounter for screening for malignant neoplasm of colon: Secondary | ICD-10-CM | POA: Diagnosis present

## 2022-11-06 MED ORDER — SODIUM CHLORIDE 0.9 % IV SOLN: 500.0000 mL | Freq: Once | INTRAVENOUS | Status: DC

## 2022-11-06 NOTE — Progress Notes (Signed)
History and Physical:  This patient presents for endoscopic testing for: Encounter Diagnosis  Name Primary?   Special screening for malignant neoplasms, colon Yes    First screening exam, average risk for CRC (had a diagnostic colonoscopy about 10 years ago in [place] working up pelvic pain that turned out to be from b/l hip conditions) Patient denies chronic abdominal pain, rectal bleeding, constipation or diarrhea.   Patient is otherwise without complaints or active issues today.   Past Medical History: Past Medical History:  Diagnosis Date   Adverse effect of anesthetic    "I have panic attacks when I come out of anesthesia."    Chronic back pain    Herniated lumbar intervertebral disc    History of cyst of breast    History of ovarian cyst    History of recurrent UTIs    Migraine with aura    Nerve pain    PONV (postoperative nausea and vomiting)    Raynaud disease    Ruptured lumbar intervertebral disc    3    Sciatica    Thyroid disease      Past Surgical History: Past Surgical History:  Procedure Laterality Date   CHOLECYSTECTOMY     2009   COLONOSCOPY     CYSTOSCOPY N/A 12/15/2019   Procedure: CYSTOSCOPY;  Surgeon: Salvadore Dom, MD;  Location: [facility];  Service: Gynecology;  Laterality: N/A;   KNEE SURGERY     right knee, 2011   TONSILLECTOMY     TOTAL LAPAROSCOPIC HYSTERECTOMY WITH SALPINGECTOMY Bilateral 12/15/2019   Procedure: TOTAL LAPAROSCOPIC HYSTERECTOMY WITH BILATERAL SALPINGECTOMY and REMOVAL OF RIGHT ADNEXAL MASS;  Surgeon: Salvadore Dom, MD;  Location: [place];  Service: Gynecology;  Laterality: Bilateral;    Allergies: Allergies  Allergen Reactions   Ciprofloxacin     Achilles pain, does not wish to take   Milk-Related Compounds Diarrhea   Prednisone     Makes blood pressure drop per patient    Outpatient Meds: Current Outpatient Medications  Medication Sig Dispense Refill   FERROUS  SULFATE PO Take by mouth daily.     gabapentin (NEURONTIN) 300 MG capsule Take 1 capsule (300 mg total) by mouth at bedtime. 90 capsule 3   IODINE, KELP, PO Take 6.25 mg by mouth daily.     Multiple Vitamin (MULTIVITAMIN WITH MINERALS) TABS tablet Take 1 tablet by mouth daily.     NP THYROID 30 MG tablet Take 30 mg by mouth every morning.     acetaminophen (TYLENOL) 500 MG tablet Take 2 tablets (1,000 mg total) by mouth every 6 (six) hours. 30 tablet 0   ibuprofen (ADVIL) 800 MG tablet Take 1 tablet (800 mg total) by mouth every 6 (six) hours. 30 tablet 0   nitrofurantoin (MACRODANTIN) 50 MG capsule 1 tablet po prn intercourse 30 capsule 2   Current Facility-Administered Medications  Medication Dose Route Frequency Provider Last Rate Last Admin   0.9 %  sodium chloride infusion  500 mL Intravenous Once Danis, Kirke Corin, MD          ___________________________________________________________________ Objective   Exam:  BP 119/68   Pulse 66   Temp 97.7 F (36.5 C) (Skin)   Ht '5\' 1"'$  (1.549 m)   Wt 135 lb (61.2 kg)   LMP 12/08/2019   SpO2 98%   BMI 25.51 kg/m   CV: regular , S1/S2 Resp: clear to auscultation bilaterally, normal RR and effort noted GI: soft, no tenderness,  with active bowel sounds.   Assessment: Encounter Diagnosis  Name Primary?   Special screening for malignant neoplasms, colon Yes     Plan: Colonoscopy  The benefits and risks of the planned procedure were described in detail with the patient or (when appropriate) their health care proxy.  Risks were outlined as including, but not limited to, bleeding, infection, perforation, adverse medication reaction leading to cardiac or pulmonary decompensation, pancreatitis (if ERCP).  The limitation of incomplete mucosal visualization was also discussed.  No guarantees or warranties were given.    The patient is appropriate for an endoscopic procedure in the ambulatory setting.   - Wilfrid Lund, MD

## 2022-11-06 NOTE — Op Note (Signed)
[place] Patient Name: Susan Leonard Procedure Date: 11/06/2022 11:12 AM MRN: [id number] Endoscopist: [employer]. Loletha Carrow , MD, [id number] Age: 56 Referring MD:  Date of Birth: 12-07-1965 Gender: Female Account #: [id number] Procedure:                Colonoscopy Indications:              Screening for colorectal malignant neoplasm, This                            is the patient's first screening colonoscopy Medicines:                Monitored Anesthesia Care Procedure:                Pre-Anesthesia Assessment:                           - Prior to the procedure, a History and Physical                            was performed, and patient medications and                            allergies were reviewed. The patient's tolerance of                            previous anesthesia was also reviewed. The risks                            and benefits of the procedure and the sedation                            options and risks were discussed with the patient.                            All questions were answered, and informed consent                            was obtained. Prior Anticoagulants: The patient has                            taken no anticoagulant or antiplatelet agents. ASA                            Grade Assessment: II - A patient with mild systemic                            disease. After reviewing the risks and benefits,                            the patient was deemed in satisfactory condition to                            undergo the procedure.  After obtaining informed consent, the colonoscope                            was passed under direct vision. Throughout the                            procedure, the patient's blood pressure, pulse, and                            oxygen saturations were monitored continuously. The                            CF [id number] #[id number] was introduced through the anus                            and advanced  to the the cecum, identified by                            appendiceal orifice and ileocecal valve. The                            colonoscopy was performed without difficulty. The                            patient tolerated the procedure well. The quality                            of the bowel preparation was good. The ileocecal                            valve, appendiceal orifice, and rectum were                            photographed. Scope In: 11:27:25 AM Scope Out: 11:43:12 AM Scope Withdrawal Time: 0 hours 11 minutes 8 seconds  Total Procedure Duration: 0 hours 15 minutes 47 seconds  Findings:                 The perianal and digital rectal examinations were                            normal.                           Repeat examination of right colon under NBI                            performed.                           The entire examined colon appeared normal on direct                            and retroflexion views. Complications:            No immediate complications. Estimated Blood Loss:  Estimated blood loss: none. Impression:               - The entire examined colon is normal on direct and                            retroflexion views.                           - No specimens collected. Recommendation:           - Patient has a contact number available for                            emergencies. The signs and symptoms of potential                            delayed complications were discussed with the                            patient. Return to normal activities tomorrow.                            Written discharge instructions were provided to the                            patient.                           - Resume previous diet.                           - Continue present medications.                           - Repeat colonoscopy in 10 years for screening                            purposes. Laquia Rosano L. Loletha Carrow, MD 11/06/2022 11:46:06 AM This report  has been signed electronically.

## 2022-11-06 NOTE — Patient Instructions (Addendum)
There were no polyps seen today!  You will need another screening colonoscopy in 10 years, you will receive a letter at that time when you are due for the procedure.   Please call us at [phone] if you have a change in bowel habits, change in family history of colo-rectal cancer, rectal bleeding or other GI concern before that time.  YOU HAD AN ENDOSCOPIC PROCEDURE TODAY AT [facility]:   Refer to the procedure report that was given to you for any specific questions about what was found during the examination.  If the procedure report does not answer your questions, please call your gastroenterologist to clarify.  If you requested that your care partner not be given the details of your procedure findings, then the procedure report has been included in a sealed envelope for you to review at your convenience later.  YOU SHOULD EXPECT: Some feelings of bloating in the abdomen. Passage of more gas than usual.  Walking can help get rid of the air that was put into your GI tract during the procedure and reduce the bloating. If you had a lower endoscopy (such as a colonoscopy or flexible sigmoidoscopy) you may notice spotting of blood in your stool or on the toilet paper. If you underwent a bowel prep for your procedure, you may not have a normal bowel movement for a few days.  Please Note:  You might notice some irritation and congestion in your nose or some drainage.  This is from the oxygen used during your procedure.  There is no need for concern and it should clear up in a day or so.  SYMPTOMS TO REPORT IMMEDIATELY:  Following lower endoscopy (colonoscopy):  Excessive amounts of blood in the stool  Significant tenderness or worsening of abdominal pains  Swelling of the abdomen that is new, acute  Fever of 100F or higher  For urgent or emergent issues, a gastroenterologist can be reached at any hour by calling [phone]. Do not use MyChart messaging for urgent concerns.    DIET:  We do recommend a small meal at first, but then you may proceed to your regular diet.  Drink plenty of fluids but you should avoid alcoholic beverages for 24 hours.  ACTIVITY:  You should plan to take it easy for the rest of today and you should NOT DRIVE or use heavy machinery until tomorrow (because of the sedation medicines used during the test).    FOLLOW UP: Our staff will call the number listed on your records the next business day following your procedure.  We will call around 7:15- 8:00 am to check on you and address any questions or concerns that you may have regarding the information given to you following your procedure. If we do not reach you, we will leave a message.     SIGNATURES/CONFIDENTIALITY: You and/or your care partner have signed paperwork which will be entered into your electronic medical record.  These signatures attest to the fact that that the information above on your After Visit Summary has been reviewed and is understood.  Full responsibility of the confidentiality of this discharge information lies with you and/or your care-partner.

## 2022-11-06 NOTE — Progress Notes (Signed)
Pt's states no medical or surgical changes since previsit or office visit. 

## 2022-11-06 NOTE — Progress Notes (Signed)
PT taken to PACU. Monitors in place. VSS. Report given to RN. 

## 2022-11-07 ENCOUNTER — Telehealth: Payer: Self-pay | Admitting: *Deleted

## 2022-11-07 NOTE — Telephone Encounter (Signed)
  Follow up Call-     11/06/2022   11:00 AM  Call back number  Post procedure Call Back phone  # [phone]  Permission to leave phone message Yes     Patient questions:  Do you have a fever, pain , or abdominal swelling? No. Pain Score  0 *  Have you tolerated food without any problems? Yes.    Have you been able to return to your normal activities? Yes.    Do you have any questions about your discharge instructions: Diet   No. Medications  No. Follow up visit  No.  Do you have questions or concerns about your Care? No.  Actions: * If pain score is 4 or above: No action needed, pain <4.

## 2022-11-21 DIAGNOSIS — Z8349 Family history of other endocrine, nutritional and metabolic diseases: Secondary | ICD-10-CM | POA: Insufficient documentation

## 2023-02-18 ENCOUNTER — Encounter: Payer: Self-pay | Admitting: Obstetrics and Gynecology

## 2023-05-16 ENCOUNTER — Encounter: Payer: Self-pay | Admitting: Obstetrics and Gynecology

## 2023-05-16 ENCOUNTER — Ambulatory Visit: Payer: 59 | Admitting: Obstetrics and Gynecology

## 2023-05-16 VITALS — BP 122/70 | HR 68 | Wt 136.0 lb

## 2023-05-16 DIAGNOSIS — R102 Pelvic and perineal pain: Secondary | ICD-10-CM

## 2023-05-16 NOTE — Progress Notes (Signed)
GYNECOLOGY  VISIT   HPI: 57 y.o.   Married White or Caucasian Not Hispanic or Latino  female   [phone] with Patient's last menstrual period was 12/08/2019.   here for evaluation of pelvic pain. She has a h/o a TLH/BS in 1/21 for pelvic pain and an adnexal mass. 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    She is having pain in the right lower quadrant. She states that it has been going on for about 2 months. She notices it a couple of times a day. The pain is a dull, pulse of pain, lasts a couple of seconds, 1-2/10 in severity.  Normal BM qd. No bladder c/o.  Sexually active, no pain, some dryness (helped with lubricant).  She is seen at [facility] to help with her burning mouth syndrome. On low dose naltrexone (3 mg daily). Also on a low dose of estrogen (biest 0.75 mg SLdaily) and testosterone (1.25 mg SL daily).   GYNECOLOGIC HISTORY: Patient's last menstrual period was 12/08/2019. Contraception:PMP/hysterectomy Menopausal hormone therapy: estrogen and testosterone         OB History     Gravida  2   Para  2   Term  2   Preterm      AB      Living  2      SAB      IAB      Ectopic      Multiple      Live Births  2              Patient Active Problem List   Diagnosis Date Noted   H/O benign breast biopsy 09/17/2022   Burning tongue syndrome 08/22/2022   Symptomatic postsurgical menopause 04/24/2022   Vaginal dryness, menopausal 08/14/2021   Abnormal mammogram 09/13/2020   Dilatation of thoracic aorta (HCC) 09/12/2020   Menopause 07/27/2020   Status post laparoscopic hysterectomy 12/15/2019   Chronic back pain    Lumbar radiculopathy 09/12/2018   Enlarged lymph node in neck 04/03/2016   Anemia 11/24/2015   Ocular migraine 08/12/2013   Migraine 12/03/1998   Raynaud disease 12/03/1978    Past Medical History:  Diagnosis Date   Adverse effect of anesthetic    "I have panic  attacks when I come out of anesthesia."    Chronic back pain    Herniated lumbar intervertebral disc    History of cyst of breast    History of ovarian cyst    History of recurrent UTIs    Migraine with aura    Nerve pain    PONV (postoperative nausea and vomiting)    Raynaud disease    Ruptured lumbar intervertebral disc    3    Sciatica    Thyroid disease     Past Surgical History:  Procedure Laterality Date   CHOLECYSTECTOMY     2009   COLONOSCOPY     CYSTOSCOPY N/A 12/15/2019   Procedure: CYSTOSCOPY;  Surgeon: Romualdo Bolk, MD;  Location: [facility];  Service: Gynecology;  Laterality: N/A;   KNEE SURGERY     right knee, 2011   TONSILLECTOMY     TOTAL LAPAROSCOPIC HYSTERECTOMY WITH SALPINGECTOMY Bilateral 12/15/2019   Procedure: TOTAL LAPAROSCOPIC HYSTERECTOMY WITH BILATERAL SALPINGECTOMY and REMOVAL OF RIGHT ADNEXAL MASS;  Surgeon: Romualdo Bolk, MD;  Location: [facility];  Service: Gynecology;  Laterality: Bilateral;    Current Outpatient Medications  Medication Sig Dispense Refill   acetaminophen (TYLENOL) 500 MG tablet Take 2 tablets (1,000 mg total) by mouth every 6 (six) hours. 30 tablet 0   FERROUS SULFATE PO Take by mouth daily.     gabapentin (NEURONTIN) 300 MG capsule Take 1 capsule (300 mg total) by mouth at bedtime. 90 capsule 3   ibuprofen (ADVIL) 800 MG tablet Take 1 tablet (800 mg total) by mouth every 6 (six) hours. 30 tablet 0   IODINE, KELP, PO Take 6.25 mg by mouth daily.     Multiple Vitamin (MULTIVITAMIN WITH MINERALS) TABS tablet Take 1 tablet by mouth daily.     nitrofurantoin (MACRODANTIN) 50 MG capsule 1 tablet po prn intercourse 30 capsule 2   NP THYROID 30 MG tablet Take 30 mg by mouth every morning.     No current facility-administered medications for this visit.     ALLERGIES: Ciprofloxacin, Milk-related compounds, and Prednisone  Family History  Problem Relation Age of Onset   Osteoporosis  Mother    Stroke Mother    Migraines Mother    Myelodysplastic syndrome Father    Heart disease Father    Coronary artery disease Brother 36       MI, needed CABG   Bladder Cancer Paternal Uncle    Stroke Maternal Grandmother    Lung cancer Maternal Grandfather    Stroke Paternal Grandmother    Colon cancer Neg Hx    Esophageal cancer Neg Hx    Rectal cancer Neg Hx    Stomach cancer Neg Hx     Social History   Socioeconomic History   Marital status: Married    Spouse name: Not on file   Number of children: Not on file   Years of education: Not on file   Highest education level: Not on file  Occupational History   Not on file  Tobacco Use   Smoking status: Never   Smokeless tobacco: Never  Vaping Use   Vaping Use: Never used  Substance and Sexual Activity   Alcohol use: Yes    Alcohol/week: 2.0 standard drinks of alcohol    Types: 2 Glasses of wine per week   Drug use: Never   Sexual activity: Yes    Birth control/protection: Condom, Surgical  Other Topics Concern   Not on file  Social History Narrative   Not on file   Social Determinants of Health   Financial Resource Strain: Not on file  Food Insecurity: Not on file  Transportation Needs: Not on file  Physical Activity: Not on file  Stress: Not on file  Social Connections: Not on file  Intimate Partner Violence: Not on file    Review of Systems  All other systems reviewed and are negative.   PHYSICAL EXAMINATION:    LMP 12/08/2019     General appearance: alert, cooperative and appears stated age  Abdomen: soft, non-tender; non distended, no masses,  no organomegaly  Pelvic: External genitalia:  no lesions              Urethra:  normal appearing urethra with no masses, tenderness or lesions              Bartholins and Skenes: normal                 Cervix: absent              Bimanual Exam:  Uterus:  uterus absent  Adnexa: no mass, fullness, tenderness              Rectovaginal: Yes.   .  Confirms.              Anus:  normal sphincter tone, no lesions  Pelvic floor: not tender  Chaperone, Carolynn Serve, CMA was present for exam.  1. Combined abdominal and pelvic pain Normal exam - US PELVIS TRANSVAGINAL NON-OB (TV ONLY); Future -If her ovaries are normal on U/S she will f/u with her primary

## 2023-07-03 NOTE — Progress Notes (Signed)
   Acute Office Visit  Subjective:    Patient ID: Susan Leonard, female    DOB: 1966-07-02, 57 y.o.   MRN: [id number]   HPI 57 y.o. [id number] presents today for ultrasound. Seen by Dr. Oscar La 05/16/23 with complaints of pelvic pain. Postmenopausal. S/P TLH/BS January 2021 for pelvic pain and an adnexal mass. 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     She is having pain in the right lower quadrant. She states that it has been going on for about 2 months. She notices it a couple of times a day. The pain is a dull, pulse of pain, lasts a couple of seconds, 1-2/10 in severity.  Normal BM qd. No bladder c/o.   Sexually active, no pain, some dryness (helped with lubricant)."  Patient's last menstrual period was 12/08/2019.    Review of Systems     Objective:    Physical Exam  LMP 12/08/2019  Wt Readings from Last 3 Encounters:  05/16/23 136 lb (61.7 kg)  11/06/22 135 lb (61.2 kg)  10/29/22 135 lb (61.2 kg)        Patient informed chaperone available to be present for breast and/or pelvic exam. Patient has requested no chaperone to be present. Patient has been advised what will be completed during breast and pelvic exam.   Assessment & Plan:   Problem List Items Addressed This Visit   None       Olivia Mackie DNP, 3:14 PM 07/03/2023

## 2023-07-04 ENCOUNTER — Ambulatory Visit (INDEPENDENT_AMBULATORY_CARE_PROVIDER_SITE_OTHER): Payer: 59

## 2023-07-04 ENCOUNTER — Ambulatory Visit (INDEPENDENT_AMBULATORY_CARE_PROVIDER_SITE_OTHER): Payer: 59 | Admitting: Nurse Practitioner

## 2023-07-04 VITALS — BP 110/68 | HR 72 | Wt 134.0 lb

## 2023-07-04 DIAGNOSIS — R109 Unspecified abdominal pain: Secondary | ICD-10-CM | POA: Diagnosis not present

## 2023-07-04 DIAGNOSIS — R102 Pelvic and perineal pain: Secondary | ICD-10-CM

## 2023-09-23 ENCOUNTER — Encounter: Payer: Self-pay | Admitting: Nurse Practitioner

## 2023-09-23 ENCOUNTER — Telehealth (INDEPENDENT_AMBULATORY_CARE_PROVIDER_SITE_OTHER): Payer: 59 | Admitting: Nurse Practitioner

## 2023-09-23 VITALS — HR 65 | Ht 62.0 in | Wt 134.0 lb

## 2023-09-23 DIAGNOSIS — R61 Generalized hyperhidrosis: Secondary | ICD-10-CM

## 2023-09-23 DIAGNOSIS — R5383 Other fatigue: Secondary | ICD-10-CM

## 2023-09-23 DIAGNOSIS — R0683 Snoring: Secondary | ICD-10-CM | POA: Diagnosis not present

## 2023-09-23 MED ORDER — GABAPENTIN 100 MG PO CAPS
100.0000 mg | ORAL_CAPSULE | Freq: Every evening | ORAL | 0 refills | Status: DC
Start: 2023-09-23 — End: 2023-12-17

## 2023-09-23 NOTE — Progress Notes (Signed)
Virtual Visit via Video Note  I connected with Susan Leonard on 09/23/23 at 11:00 AM EDT by audio enabled telemedicine application and verified that I am speaking with the correct person using two identifiers. Unable to connect by video.   Location: Patient: Work Provider: Office   I discussed the limitations of evaluation and management by telemedicine and the availability of in person appointments. The patient expressed understanding and agreed to proceed.  History of Present Illness: Presents today for refill on Gabapentin. Has been taking for night sweats for a couple of years. She is considering stopping since she is now on HRT (prescribed by [facility]). Has been experiencing restless sleep and snoring (per husband). Snoring is mostly when she is on her back. Wakes up feeling tired most days.    Observations/Objective: Unable to perform physical exam due to limitations of virtual visit.   Assessment and Plan:  Night sweats - Plan: gabapentin (NEURONTIN) 100 MG capsule. Currently taking 300 mg nightly. Agree with weaning off Gabapentin due to possible sedating side effects and now on HRT. Will decrease to 200 mg for 3-5 nights, then decrease to 100 mg for a week. May then take 100 mg every other night or discontinue if tolerating. #30 provided to get through weaning process.   Snoring - If snoring does not improve after Gabapentin is discontinued. Recommend sleep study.   Fatigue, unspecified type - could be s/t snoring and restless sleep.   Follow Up Instructions:  Return if symptoms worsen or fail to improve.  I discussed the assessment and treatment plan with the patient. The patient was provided an opportunity to ask questions and all were answered. The patient agreed with the plan and demonstrated an understanding of the instructions.   The patient was advised to call back or seek an in-person evaluation if the symptoms worsen or if the condition fails to  improve as anticipated.  I provided 15 minutes of non-face-to-face time during this encounter.   Olivia Mackie, NP

## 2023-10-02 ENCOUNTER — Ambulatory Visit: Payer: 59 | Admitting: Obstetrics and Gynecology

## 2023-12-17 ENCOUNTER — Ambulatory Visit (INDEPENDENT_AMBULATORY_CARE_PROVIDER_SITE_OTHER): Payer: 59 | Admitting: Nurse Practitioner

## 2023-12-17 VITALS — BP 118/74 | HR 71 | Ht 61.0 in | Wt 133.0 lb

## 2023-12-17 DIAGNOSIS — Z780 Asymptomatic menopausal state: Secondary | ICD-10-CM

## 2023-12-17 DIAGNOSIS — Z79890 Hormone replacement therapy (postmenopausal): Secondary | ICD-10-CM

## 2023-12-17 DIAGNOSIS — N390 Urinary tract infection, site not specified: Secondary | ICD-10-CM | POA: Diagnosis not present

## 2023-12-17 DIAGNOSIS — Z01419 Encounter for gynecological examination (general) (routine) without abnormal findings: Secondary | ICD-10-CM | POA: Diagnosis not present

## 2023-12-17 MED ORDER — NITROFURANTOIN MACROCRYSTAL 50 MG PO CAPS: 50.0000 mg | ORAL_CAPSULE | ORAL | 2 refills | Status: DC | PRN

## 2023-12-17 NOTE — Progress Notes (Signed)
 Susan Leonard [facility] 08-Apr-1966 [id number]   History:  58 y.o. G2P2002 presents for annual exam. Postmenopausal. S/P 2021 TLH/BS for pelvic pain and an adnexal mass. 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 Was taking Gabapentin  for night sweats - discontinued last year and doing fine. Taking sublingual estrogen & testosterone, PO progesterone and vaginal estrogen, prescribed at [facility]. Takes Nitrofurantoin  with intercourse. H/O hypothyroidism, migraine with aura.   Gynecologic History Patient's last menstrual period was 12/08/2019.   Contraception/Family planning: status post hysterectomy Sexually active: Yes  Health Maintenance Last Pap: 08/13/2019. Results were: Normal neg HPV Last mammogram: 11/20/2023. Results were: Normal Last colonoscopy: 11/06/2022. Results were: Normal Last Dexa: ~2 years ago. Results were: Normal  Past medical history, past surgical history, family history and social history were all reviewed and documented in the EPIC chart. Married. 2 daughters, both in [employer] in [place].   ROS:  A ROS was performed and pertinent positives and negatives are included.  Exam:  Vitals:   12/17/23 1455  BP: 118/74  Pulse: 71  SpO2: 99%  Weight: 133 lb (60.3 kg)  Height: 5' 1 (1.549 m)   Body mass index is 25.13 kg/m.   General appearance:  Normal Thyroid:  Symmetrical, normal in size, without palpable masses or nodularity. Respiratory  Auscultation:  Clear without wheezing or rhonchi Cardiovascular  Auscultation:  Regular rate, without rubs, murmurs or gallops  Edema/varicosities:  Not grossly evident Abdominal  Soft,nontender, without masses, guarding or rebound.  Liver/spleen:  No organomegaly noted  Hernia:  None appreciated  Skin  Inspection:  Grossly normal Breasts: Examined lying and sitting.   Right: Without masses, retractions, nipple discharge or axillary  adenopathy.   Left: Without masses, retractions, nipple discharge or axillary adenopathy. Pelvic: External genitalia:  no lesions              Urethra:  normal appearing urethra with no masses, tenderness or lesions              Bartholins and Skenes: normal                 Vagina: normal appearing vagina with normal color and discharge, no lesions              Cervix: absent Bimanual Exam:  Uterus: absent              Adnexa: no mass, fullness, tenderness              Rectovaginal: Deferred              Anus:  normal, no lesions  Patient informed chaperone available to be present for breast and pelvic exam. Patient has requested no chaperone to be present. Patient has been advised what will be completed during breast and pelvic exam.   Assessment/Plan:  58 y.o. G2P2002 for annual exam.   Well female exam with routine gynecological exam - Education provided on SBEs, importance of preventative screenings, current guidelines, high calcium diet, regular exercise, and multivitamin daily. Labs with PCP and [facility].   Postmenopausal - S/P 2021 TLH/BS. On HRT.   Postcoital UTI - Plan: nitrofurantoin  (MACRODANTIN ) 50 MG capsule as needed with intercourse.   Postmenopausal hormone therapy - prescribed by [facility]. Sublingual estrogen & testosterone, PO progesterone and vaginal estrogen  Screening for cervical cancer - Normal Pap history. No longer screening per guidelines.   Screening for breast cancer - Normal mammogram  history.  Continue annual screenings.  Normal breast exam today.  Screening for colon cancer - 11/2022 colonoscopy. Will repeat at 10-year interval per GI's recommendation.   Screening for osteoporosis - Normal DXA a couple of years ago. Family history of osteoporosis.   Return in about 1 year (around 12/16/2024) for Annual.   [name] DNP, 3:13 PM 12/17/2023

## 2024-12-30 ENCOUNTER — Other Ambulatory Visit: Payer: Self-pay | Admitting: Nurse Practitioner

## 2024-12-30 DIAGNOSIS — N390 Urinary tract infection, site not specified: Secondary | ICD-10-CM

## 2024-12-30 NOTE — Telephone Encounter (Signed)
 Med refill request:   nitrofurantoin  (MACRODANTIN ) 50 MG capsule  Start:  12/17/23 Disp:  30 cap Refills:  2  Last AEX:  12/17/23 Next AEX:  Not yet scheduled Last MMG (if hormonal med):  11/20/23 (Per Care Everywhere) Refill authorized? Please Advise.

## 2025-01-28 ENCOUNTER — Ambulatory Visit: Admitting: Nurse Practitioner
